# Patient Record
Sex: Female | Born: 1960 | Race: Black or African American | Hispanic: No | Marital: Single | Smoking: Former smoker
Health system: Southern US, Community
[De-identification: ages and names within clinical notes are randomized; demographics above are authoritative.]

## PROBLEM LIST (undated history)

## (undated) DIAGNOSIS — J189 Pneumonia, unspecified organism: Secondary | ICD-10-CM

## (undated) DIAGNOSIS — R51 Headache: Secondary | ICD-10-CM

## (undated) DIAGNOSIS — I2510 Atherosclerotic heart disease of native coronary artery without angina pectoris: Secondary | ICD-10-CM

## (undated) DIAGNOSIS — E079 Disorder of thyroid, unspecified: Secondary | ICD-10-CM

## (undated) DIAGNOSIS — E785 Hyperlipidemia, unspecified: Secondary | ICD-10-CM

## (undated) DIAGNOSIS — Z950 Presence of cardiac pacemaker: Secondary | ICD-10-CM

## (undated) DIAGNOSIS — E039 Hypothyroidism, unspecified: Secondary | ICD-10-CM

## (undated) DIAGNOSIS — N189 Chronic kidney disease, unspecified: Secondary | ICD-10-CM

## (undated) DIAGNOSIS — R569 Unspecified convulsions: Secondary | ICD-10-CM

## (undated) DIAGNOSIS — M1990 Unspecified osteoarthritis, unspecified site: Secondary | ICD-10-CM

## (undated) DIAGNOSIS — I10 Essential (primary) hypertension: Secondary | ICD-10-CM

## (undated) DIAGNOSIS — Z9289 Personal history of other medical treatment: Secondary | ICD-10-CM

## (undated) DIAGNOSIS — D649 Anemia, unspecified: Secondary | ICD-10-CM

## (undated) HISTORY — DX: Disorder of thyroid, unspecified: E07.9

## (undated) HISTORY — PX: DILATION AND CURETTAGE OF UTERUS: SHX78

## (undated) HISTORY — PX: COLONOSCOPY: SHX174

## (undated) HISTORY — DX: Essential (primary) hypertension: I10

## (undated) HISTORY — PX: ENDOMETRIAL BIOPSY: SHX622

## (undated) HISTORY — DX: Hyperlipidemia, unspecified: E78.5

## (undated) HISTORY — DX: Atherosclerotic heart disease of native coronary artery without angina pectoris: I25.10

## (undated) HISTORY — DX: Chronic kidney disease, unspecified: N18.9

## (undated) HISTORY — PX: CORONARY ARTERY BYPASS GRAFT: SHX141

## (undated) HISTORY — PX: RENAL BIOPSY: SHX156

## (undated) HISTORY — PX: PACEMAKER INSERTION: SHX728

## (undated) HISTORY — DX: Anemia, unspecified: D64.9

---

## 2003-06-03 ENCOUNTER — Inpatient Hospital Stay (HOSPITAL_COMMUNITY): Admission: EM | Admit: 2003-06-03 | Discharge: 2003-06-05 | Payer: Self-pay | Admitting: Emergency Medicine

## 2005-02-07 ENCOUNTER — Inpatient Hospital Stay (HOSPITAL_COMMUNITY): Admission: RE | Admit: 2005-02-07 | Discharge: 2005-02-20 | Payer: Self-pay | Admitting: Cardiology

## 2005-02-07 ENCOUNTER — Ambulatory Visit: Payer: Self-pay | Admitting: Dentistry

## 2005-03-08 ENCOUNTER — Inpatient Hospital Stay (HOSPITAL_COMMUNITY): Admission: AD | Admit: 2005-03-08 | Discharge: 2005-03-13 | Payer: Self-pay | Admitting: Cardiothoracic Surgery

## 2005-03-22 ENCOUNTER — Encounter: Admission: RE | Admit: 2005-03-22 | Discharge: 2005-03-22 | Payer: Self-pay | Admitting: Cardiothoracic Surgery

## 2007-11-24 IMAGING — DX DG ORTHOPANTOGRAM /PANORAMIC
1 series · 1 of 1 positions shown · non-contrast
Comparison: none

CLINICAL DATA: Pre-open heart surgery work-up.  Chipped teeth.
PANOREX - 1 VIEW:
There are multiple missing teeth.  There is root for a right maxillary molar.  The crown is not present.  There are multiple (4-5) roots for left maxillary teeth with the crowns missing.  Central maxillary teeth are missing.  Mandibular central incisors are intact.  There is very poor dentition with prominent periapical lucencies about multiple right mandibular tooth roots.  The right mandibular first and second molars appear to be present with prominent periapical lucencies involving the first molar.  There appears to be a piece of a tooth projecting just lateral to the right mandibular second molar.  The crowns of the right and left mandibular first molars appear destroyed.

[view not recorded]
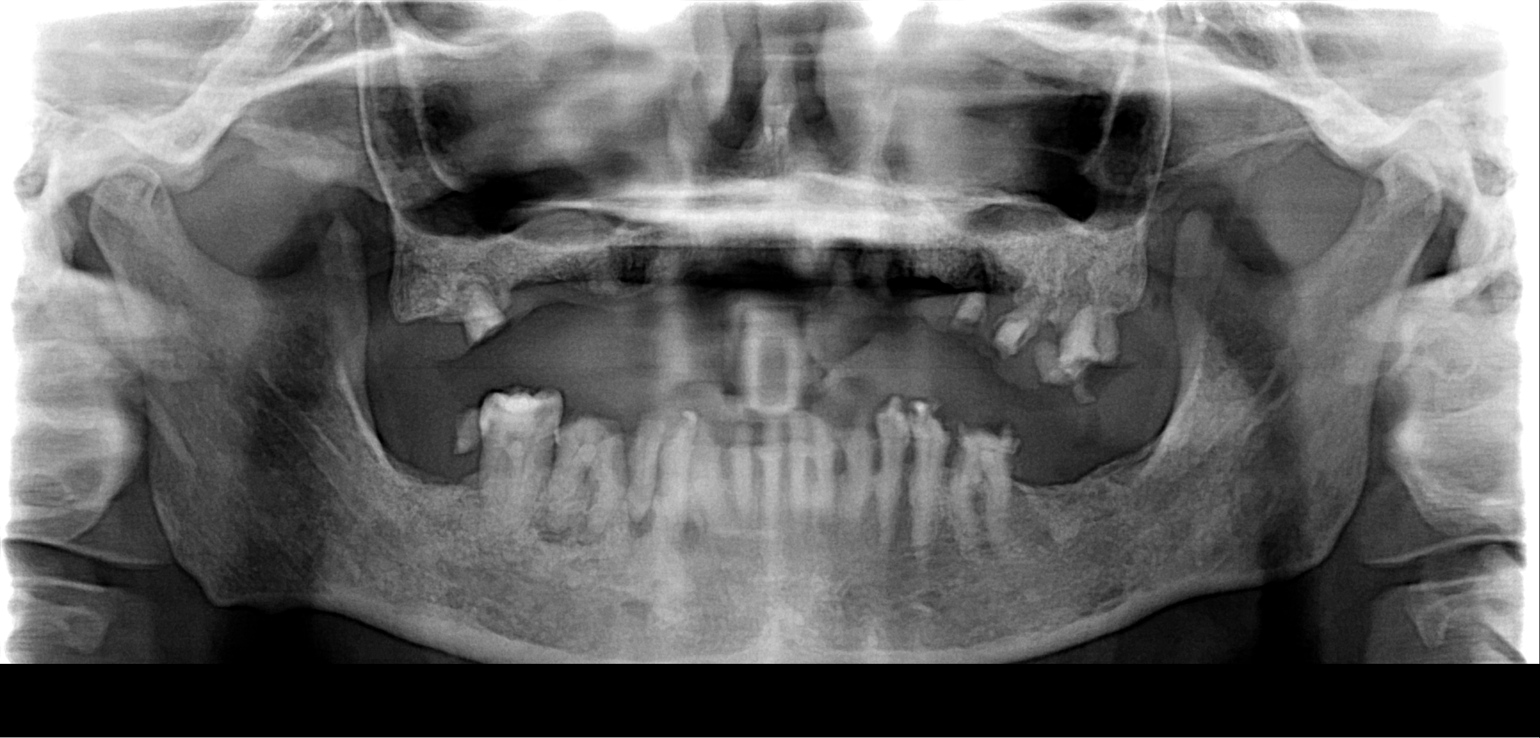

[1 of 1 positions shown; findings below may reference images not displayed]

IMPRESSION: Findings of very poor dental hygiene.  See comments above.

## 2007-11-27 IMAGING — CR DG CHEST 1V PORT
1 series · 1 of 1 positions shown · non-contrast
Comparison: Preoperative exam 02/08/05.

CLINICAL DATA: Chest pain.  Status post CABG.  
PORTABLE CHEST - 1 VIEW, 02/11/05, 2556 HOURS:

[view not recorded]
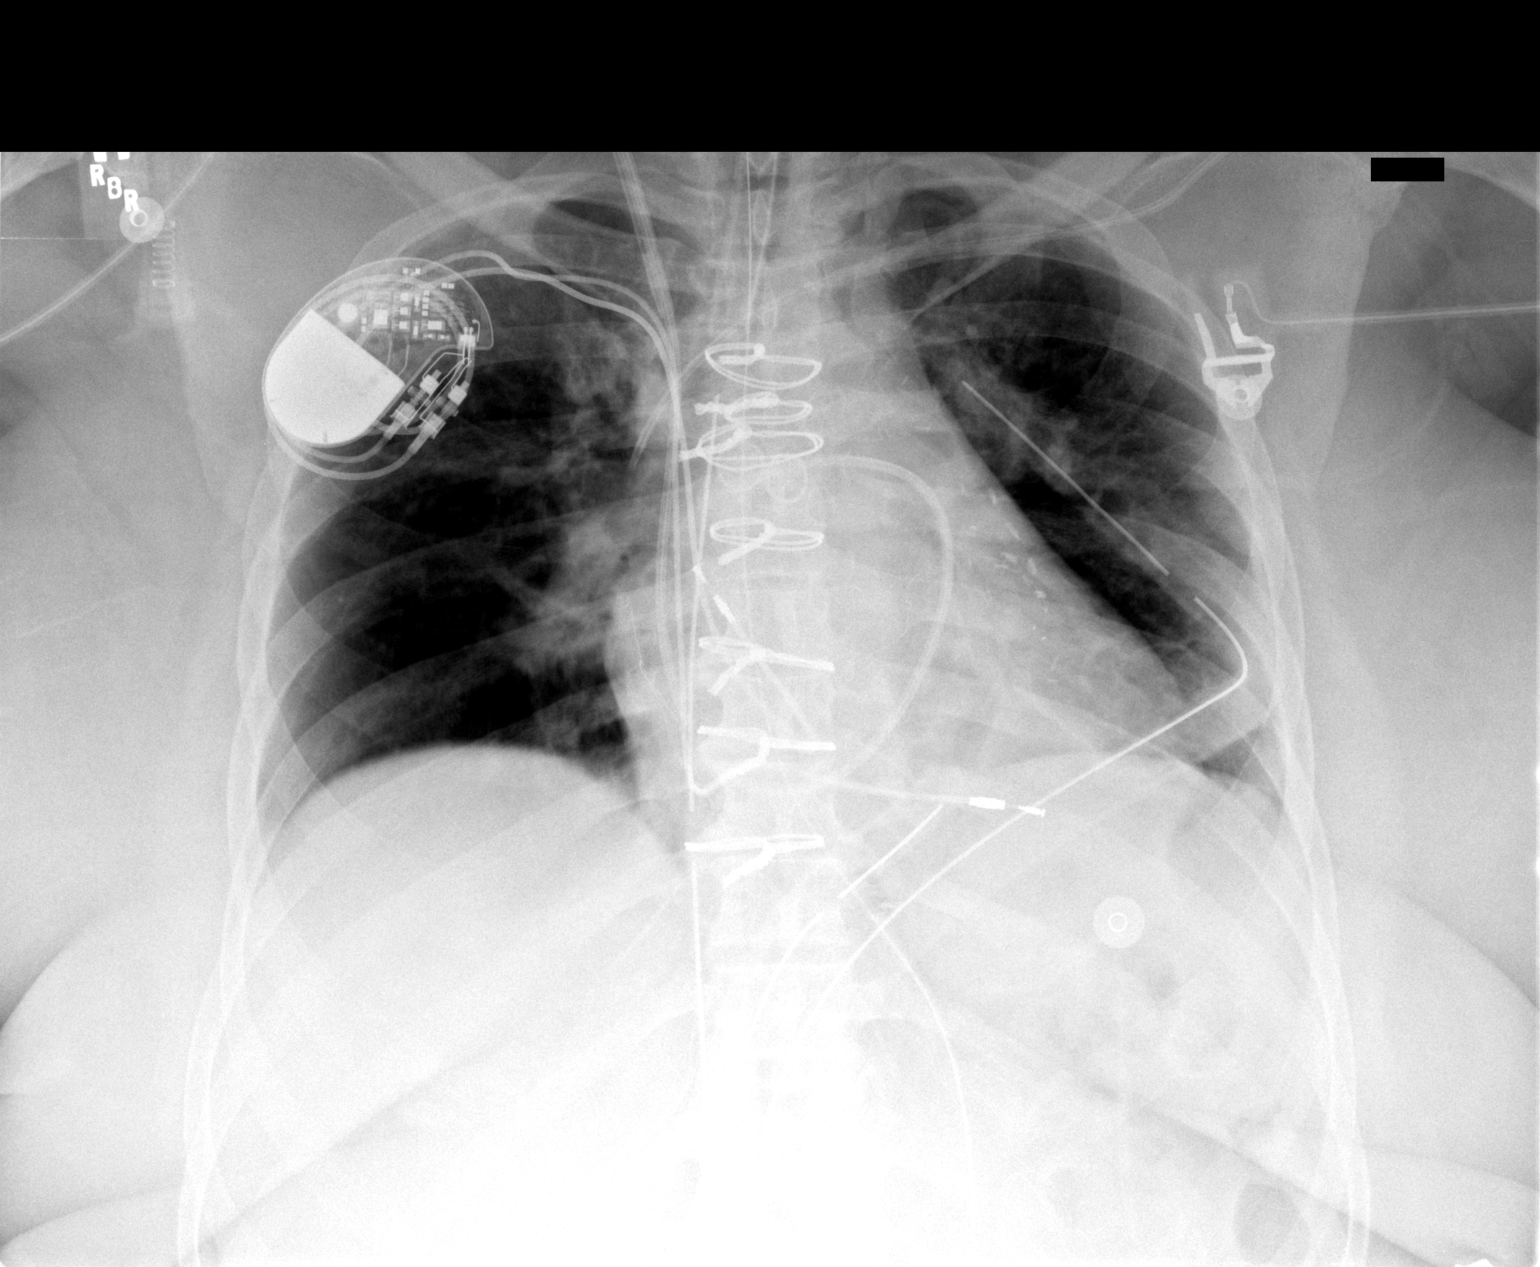

[1 of 1 positions shown; findings below may reference images not displayed]

FINDINGS: Sternal wire sutures and mediastinal clips, mediastinal and left pleural chest tubes.  Left subclavian central venous catheter tip is in the superior vena cava.  Satisfactory endotracheal tube and nasogastric tube positions.  Pre-existing pacer battery pack with right atrial and right ventricular pacer leads.  Minimal atelectasis at the left base.  No pneumothorax or vascular congestion.
IMPRESSION: Satisfactory post CABG chest x-ray.

## 2013-05-13 ENCOUNTER — Other Ambulatory Visit: Payer: Self-pay | Admitting: *Deleted

## 2013-05-13 DIAGNOSIS — N186 End stage renal disease: Secondary | ICD-10-CM

## 2013-05-13 DIAGNOSIS — Z01810 Encounter for preprocedural cardiovascular examination: Secondary | ICD-10-CM

## 2013-05-21 ENCOUNTER — Encounter: Payer: Self-pay | Admitting: Vascular Surgery

## 2013-05-27 ENCOUNTER — Encounter: Payer: Self-pay | Admitting: Vascular Surgery

## 2013-05-28 ENCOUNTER — Ambulatory Visit (HOSPITAL_COMMUNITY)
Admission: RE | Admit: 2013-05-28 | Discharge: 2013-05-28 | Disposition: A | Discharge status: Home / Self Care | Payer: No Typology Code available for payment source | Source: Ambulatory Visit | Attending: Vascular Surgery | Admitting: Vascular Surgery

## 2013-05-28 ENCOUNTER — Ambulatory Visit (INDEPENDENT_AMBULATORY_CARE_PROVIDER_SITE_OTHER): Payer: PRIVATE HEALTH INSURANCE | Admitting: Vascular Surgery

## 2013-05-28 ENCOUNTER — Other Ambulatory Visit: Payer: No Typology Code available for payment source

## 2013-05-28 ENCOUNTER — Ambulatory Visit (INDEPENDENT_AMBULATORY_CARE_PROVIDER_SITE_OTHER)
Admission: RE | Admit: 2013-05-28 | Discharge: 2013-05-28 | Disposition: A | Discharge status: Home / Self Care | Payer: No Typology Code available for payment source | Source: Ambulatory Visit | Attending: Vascular Surgery | Admitting: Vascular Surgery

## 2013-05-28 ENCOUNTER — Encounter: Payer: Self-pay | Admitting: Vascular Surgery

## 2013-05-28 VITALS — BP 169/88 | HR 70 | Ht 65.0 in | Wt 215.0 lb

## 2013-05-28 DIAGNOSIS — N186 End stage renal disease: Secondary | ICD-10-CM

## 2013-05-28 DIAGNOSIS — Z01810 Encounter for preprocedural cardiovascular examination: Secondary | ICD-10-CM

## 2013-05-28 NOTE — Progress Notes (Signed)
  Referred by:  Belayenh S Befekadu, MD [address] [place], [place] [address]  Reason for referral: New access  History of Present Illness  Amy Mathews is a 52 y.o. (11/16/1960) female who presents for evaluation for permanent access.  The patient is left hand dominant but uses her right arm for most tasks.  The patient has not had previous access procedures.  Previous central venous cannulation procedures include: RIJ TDC and prior Left temp. Dialysis catheter.  The patient has never had a PPM placed.   Past Medical History  Diagnosis Date  . Anemia   . Chronic kidney disease   . CAD (coronary artery disease)   . Thyroid disease     hypothyroidism  . Hyperlipidemia   . Hypertension     Past Surgical History  Procedure Laterality Date  . Coronary artery bypass graft    . Pacemaker insertion    . Endometrial biopsy    . Renal biopsy      History   Social History  . Marital Status: Single    Spouse Name: N/A    Number of Children: N/A  . Years of Education: N/A   Occupational History  . Not on file.   Social History Main Topics  . Smoking status: Former Smoker    Types: Cigarettes    Quit date: 03/05/2003  . Smokeless tobacco: Never Used  . Alcohol Use: No  . Drug Use: No  . Sexual Activity: Not on file   Other Topics Concern  . Not on file   Social History Narrative  . No narrative on file    Family history: pt does not know her parent's medical history  Current Outpatient Prescriptions on File Prior to Visit  Medication Sig Dispense Refill  . allopurinol (ZYLOPRIM) 100 MG tablet Take 100 mg by mouth daily. Take two tablets daily      . aspirin 81 MG tablet Take 81 mg by mouth daily.      . calcium acetate (PHOSLO) 667 MG capsule Take 667 mg by mouth 3 (three) times daily with meals.      . furosemide (LASIX) 40 MG tablet Take 40 mg by mouth 2 (two) times daily.      . hydrALAZINE (APRESOLINE) 25 MG tablet Take 25 mg by mouth 2  (two) times daily.      . labetalol (NORMODYNE) 300 MG tablet Take 300 mg by mouth 2 (two) times daily.      . levothyroxine (SYNTHROID, LEVOTHROID) 125 MCG tablet Take 125 mcg by mouth daily before breakfast.      . omeprazole (PRILOSEC) 40 MG capsule Take 40 mg by mouth daily.      . simvastatin (ZOCOR) 20 MG tablet Take 20 mg by mouth daily.      . ferrous sulfate 324 (65 FE) MG TBEC Take 324 mg by mouth 2 (two) times daily.       No current facility-administered medications on file prior to visit.    Allergies  Allergen Reactions  . Penicillins     REVIEW OF SYSTEMS:  (Positives checked otherwise negative)  CARDIOVASCULAR:  [] chest pain, [] chest pressure, [] palpitations, [] shortness of breath when laying flat, [] shortness of breath with exertion,  [] pain in feet when walking, [] pain in feet when laying flat, [] history of blood clot in veins (DVT), [] history of phlebitis, [] swelling in legs, [] varicose veins  PULMONARY:  [] productive cough, [] asthma, []   wheezing  NEUROLOGIC:  []  weakness in arms or legs, []  numbness in arms or legs, []  difficulty speaking or slurred speech, []  temporary loss of vision in one eye, [x]  dizziness  HEMATOLOGIC:  []  bleeding problems, []  problems with blood clotting too easily  MUSCULOSKEL:  []  joint pain, []  joint swelling  GASTROINTEST:  []  vomiting blood, []  blood in stool     GENITOURINARY:  []  burning with urination, []  blood in urine  PSYCHIATRIC:  []  history of major depression  INTEGUMENTARY:  []  rashes, []  ulcers  CONSTITUTIONAL:  []  fever, []  chills  Physical Examination  Filed Vitals:   05/28/13 1423  BP: 169/88  Pulse: 70  Height: 5\' 5"  (1.651 m)  Weight: 215 lb (97.523 kg)  SpO2: 99%   Body mass index is 35.78 kg/(m^2).  General: A&O x 3, WD, mildly obese  Head: [place]/AT  Ear/Nose/Throat: Hearing grossly intact, nares w/o erythema or drainage, oropharynx w/o Erythema/Exudate, Mallampati score: 3  Eyes:  PERRLA, EOMI  Neck: Supple, no nuchal rigidity, no palpable LAD, bilateral neck incision c/w prior lines  Pulmonary: Sym exp, good air movt, CTAB, no rales, rhonchi, & wheezing  Cardiac: RRR, Nl S1, S2, no Murmurs, rubs or gallops  Vascular: Vessel Right Left  Radial Palpable Palpable  Ulnar Faintly Palpable Faintly Palpable  Brachial Palpable Palpable  Carotid Palpable, without bruit Palpable, without bruit  Aorta Not palpable N/A  Femoral Palpable Palpable  Popliteal Not palpable Not palpable  PT Palpable Palpable  DP Palpable Palpable   Gastrointestinal: soft, NTND, -G/R, - HSM, - masses, - CVAT B  Musculoskeletal: M/S 5/5 throughout , Extremities without ischemic changes   Neurologic: CN 2-12 intact , Pain and light touch intact in extremities , Motor exam as listed above  Psychiatric: Judgment intact, Mood & affect appropriate for pt's clinical situation  Dermatologic: See M/S exam for extremity exam, no rashes otherwise noted  Lymph : No Cervical, Axillary, or Inguinal lymphadenopathy   Non-Invasive Vascular Imaging  Vein Mapping  (Date: 05/28/2013):   R arm: acceptable vein conduits include none  L arm: acceptable vein conduits include none  BUE arterial doppler (05/28/2013)  Triphasic flow in all arteries  Only brachial arteries larger enough for use for access purposes  Outside Studies/Documentation 5 pages of outside documents were reviewed including: outpatient nephrology chart and outpatient hospital chart.  Medical Decision Making  Amy Mathews is a 53 y.o. female who presents with ESRD requiring hemodialysis.   Based on vein mapping and examination, this patient's permanent access options include: likely BUA AVG.    I would recommend: B arm and central venogram to better determine this patient's access options given the multiple scars in the neck/chest c/w central access.  The patient has agreed to proceed with the above procedure which  will be scheduled 30 Mar 15.  Amy SakeBrian Arnika Larzelere, MD [facility] Office: [phone] Pager: [phone]  05/28/2013, 3:27 PM

## 2013-05-31 ENCOUNTER — Encounter (HOSPITAL_COMMUNITY)
Admission: RE | Disposition: A | Discharge status: Home / Self Care | Payer: Self-pay | Source: Ambulatory Visit | Attending: Vascular Surgery

## 2013-05-31 ENCOUNTER — Ambulatory Visit (HOSPITAL_COMMUNITY)
Admission: RE | Admit: 2013-05-31 | Discharge: 2013-05-31 | Disposition: A | Discharge status: Home / Self Care | Payer: PRIVATE HEALTH INSURANCE | Source: Ambulatory Visit | Attending: Vascular Surgery | Admitting: Vascular Surgery

## 2013-05-31 DIAGNOSIS — E039 Hypothyroidism, unspecified: Secondary | ICD-10-CM | POA: Insufficient documentation

## 2013-05-31 DIAGNOSIS — Z951 Presence of aortocoronary bypass graft: Secondary | ICD-10-CM | POA: Insufficient documentation

## 2013-05-31 DIAGNOSIS — Z7982 Long term (current) use of aspirin: Secondary | ICD-10-CM | POA: Insufficient documentation

## 2013-05-31 DIAGNOSIS — E785 Hyperlipidemia, unspecified: Secondary | ICD-10-CM | POA: Insufficient documentation

## 2013-05-31 DIAGNOSIS — I2510 Atherosclerotic heart disease of native coronary artery without angina pectoris: Secondary | ICD-10-CM | POA: Insufficient documentation

## 2013-05-31 DIAGNOSIS — N186 End stage renal disease: Secondary | ICD-10-CM

## 2013-05-31 DIAGNOSIS — I120 Hypertensive chronic kidney disease with stage 5 chronic kidney disease or end stage renal disease: Secondary | ICD-10-CM | POA: Insufficient documentation

## 2013-05-31 DIAGNOSIS — Z87891 Personal history of nicotine dependence: Secondary | ICD-10-CM | POA: Insufficient documentation

## 2013-05-31 DIAGNOSIS — Z950 Presence of cardiac pacemaker: Secondary | ICD-10-CM | POA: Insufficient documentation

## 2013-05-31 DIAGNOSIS — D649 Anemia, unspecified: Secondary | ICD-10-CM | POA: Insufficient documentation

## 2013-05-31 DIAGNOSIS — Z992 Dependence on renal dialysis: Secondary | ICD-10-CM | POA: Insufficient documentation

## 2013-05-31 HISTORY — PX: VENOGRAM: SHX5497

## 2013-05-31 LAB — POCT I-STAT, CHEM 8
BUN: 29 mg/dL — AB (ref 6–23)
CALCIUM ION: 1.14 mmol/L (ref 1.12–1.23)
Chloride: 102 mEq/L (ref 96–112)
Creatinine, Ser: 6.4 mg/dL — ABNORMAL HIGH (ref 0.50–1.10)
Glucose, Bld: 87 mg/dL (ref 70–99)
HEMATOCRIT: 28 % — AB (ref 36.0–46.0)
Hemoglobin: 9.5 g/dL — ABNORMAL LOW (ref 12.0–15.0)
Potassium: 4.3 mEq/L (ref 3.7–5.3)
Sodium: 139 mEq/L (ref 137–147)
TCO2: 24 mmol/L (ref 0–100)

## 2013-05-31 SURGERY — VENOGRAM
Anesthesia: LOCAL

## 2013-05-31 MED ORDER — SODIUM CHLORIDE 0.9 % IJ SOLN: 3.0000 mL | INTRAMUSCULAR | Status: DC | PRN

## 2013-05-31 NOTE — Op Note (Signed)
   PATIENT: Ennis FortsMonica J Sliter   MRN: [id number] DOB: 1961-01-29    DATE OF PROCEDURE: 05/31/2013  INDICATIONS: Ennis FortsMonica J Saha is a 53 y.o. female who presented for evaluation for new hemodialysis access. She was set up for a venogram prior to scheduling her surgery by Dr. Imogene Burnhen.  PROCEDURE: bilateral upper extremity venogram  SURGEON: Di Kindlehristopher S. Edilia Boickson, MD, FACS  ANESTHESIA: none   EBL: none  TECHNIQUE: Patient was brought to the peripheral vascular lab after a peripheral IV was started on the dorsum of both hands. Initially study was obtained on the right side. Contrast was injected through the IV and the veins visualized from the arm to the chest. Next contrast was injected through the IV and the left hand and the veins were visualized from the hand to the central veins.  FINDINGS:  1. On the right side, the brachial vein is patent as is the axillary and subclavian veins. The cephalic vein is small but patent. The patient has a pacemaker on the right and also a right IJ tunneled dialysis catheter. There is poor visualization of the right brachiocephalic vein and superior vena cava. 2. On the left side, the brachial vein is patent as is the cephalic vein although the cephalic vein is small. The axillary subclavian and left brachiocephalic veins are patent. There is poor visualization of the superior vena cava.  Waverly Ferrarihristopher Jere Bostrom, MD, FACS [facility]  DATE OF DICTATION:   05/31/2013

## 2013-05-31 NOTE — Discharge Instructions (Signed)
Venogram, Care After ° °Refer to this sheet in the next few weeks. These instructions provide you with information on caring for yourself after your procedure. Your health care provider may also give you more specific instructions. Your treatment has been planned according to current medical practices, but problems sometimes occur. Call your health care provider if you have any problems or questions after your procedure. °WHAT TO EXPECT AFTER THE PROCEDURE °After your procedure, it is typical to have the following sensations: °· Mild discomfort at the catheter insertion site. °HOME CARE INSTRUCTIONS  °· Take all medicines exactly as directed. °· Follow any prescribed diet. °· Follow instructions regarding both rest and physical activity. °· Drink more fluids for the first several days after the procedure, in order to help flush dye from your kidneys. °SEEK MEDICAL CARE IF: °· You develop a rash. °SEEK IMMEDIATE MEDICAL CARE IF °· You have fever not controlled by medicine. °· There is pain, drainage, bleeding, redness, swelling, warmth or a red streak at the site of the IV tube. °· The extremity where your IV tube was placed becomes discolored, numb, or cool. °· You have difficulty breathing or shortness of breath. °· You develop chest pain. °· You have excessive dizziness or fainting. °Document Released: 12/09/2012 Document Reviewed: 10/26/2012 °[employer]® Patient Information ©2014 [employer]. ° °

## 2013-05-31 NOTE — H&P (View-Only) (Signed)
Referred by:  Jamse MeadBelayenh S Befekadu, MD [phone] [place], [place] [id number]  Reason for referral: New access  History of Present Illness  Amy Mathews is a 53 y.o. (Nov 06, 1960) female who presents for evaluation for permanent access.  The patient is left hand dominant but uses her right arm for most tasks.  The patient has not had previous access procedures.  Previous central venous cannulation procedures include: RIJ TDC and prior Left temp. Dialysis catheter.  The patient has never had a PPM placed.   Past Medical History  Diagnosis Date  . Anemia   . Chronic kidney disease   . CAD (coronary artery disease)   . Thyroid disease     hypothyroidism  . Hyperlipidemia   . Hypertension     Past Surgical History  Procedure Laterality Date  . Coronary artery bypass graft    . Pacemaker insertion    . Endometrial biopsy    . Renal biopsy      History   Social History  . Marital Status: Single    Spouse Name: N/A    Number of Children: N/A  . Years of Education: N/A   Occupational History  . Not on file.   Social History Main Topics  . Smoking status: Former Smoker    Types: Cigarettes    Quit date: 03/05/2003  . Smokeless tobacco: Never Used  . Alcohol Use: No  . Drug Use: No  . Sexual Activity: Not on file   Other Topics Concern  . Not on file   Social History Narrative  . No narrative on file    Family history: pt does not know her parent's medical history  Current Outpatient Prescriptions on File Prior to Visit  Medication Sig Dispense Refill  . allopurinol (ZYLOPRIM) 100 MG tablet Take 100 mg by mouth daily. Take two tablets daily      . aspirin 81 MG tablet Take 81 mg by mouth daily.      . calcium acetate (PHOSLO) 667 MG capsule Take 667 mg by mouth 3 (three) times daily with meals.      . furosemide (LASIX) 40 MG tablet Take 40 mg by mouth 2 (two) times daily.      . hydrALAZINE (APRESOLINE) 25 MG tablet Take 25 mg by mouth 2  (two) times daily.      Marland Kitchen. labetalol (NORMODYNE) 300 MG tablet Take 300 mg by mouth 2 (two) times daily.      Marland Kitchen. levothyroxine (SYNTHROID, LEVOTHROID) 125 MCG tablet Take 125 mcg by mouth daily before breakfast.      . omeprazole (PRILOSEC) 40 MG capsule Take 40 mg by mouth daily.      . simvastatin (ZOCOR) 20 MG tablet Take 20 mg by mouth daily.      . ferrous sulfate 324 (65 FE) MG TBEC Take 324 mg by mouth 2 (two) times daily.       No current facility-administered medications on file prior to visit.    Allergies  Allergen Reactions  . Penicillins     REVIEW OF SYSTEMS:  (Positives checked otherwise negative)  CARDIOVASCULAR:  []  chest pain, []  chest pressure, []  palpitations, []  shortness of breath when laying flat, []  shortness of breath with exertion,  []  pain in feet when walking, []  pain in feet when laying flat, []  history of blood clot in veins (DVT), []  history of phlebitis, []  swelling in legs, []  varicose veins  PULMONARY:  []  productive cough, []  asthma, []   wheezing  NEUROLOGIC:  []  weakness in arms or legs, []  numbness in arms or legs, []  difficulty speaking or slurred speech, []  temporary loss of vision in one eye, [x]  dizziness  HEMATOLOGIC:  []  bleeding problems, []  problems with blood clotting too easily  MUSCULOSKEL:  []  joint pain, []  joint swelling  GASTROINTEST:  []  vomiting blood, []  blood in stool     GENITOURINARY:  []  burning with urination, []  blood in urine  PSYCHIATRIC:  []  history of major depression  INTEGUMENTARY:  []  rashes, []  ulcers  CONSTITUTIONAL:  []  fever, []  chills  Physical Examination  Filed Vitals:   05/28/13 1423  BP: 169/88  Pulse: 70  Height: 5\' 5"  (1.651 m)  Weight: 215 lb (97.523 kg)  SpO2: 99%   Body mass index is 35.78 kg/(m^2).  General: A&O x 3, WD, mildly obese  Head: [place]/AT  Ear/Nose/Throat: Hearing grossly intact, nares w/o erythema or drainage, oropharynx w/o Erythema/Exudate, Mallampati score: 3  Eyes:  PERRLA, EOMI  Neck: Supple, no nuchal rigidity, no palpable LAD, bilateral neck incision c/w prior lines  Pulmonary: Sym exp, good air movt, CTAB, no rales, rhonchi, & wheezing  Cardiac: RRR, Nl S1, S2, no Murmurs, rubs or gallops  Vascular: Vessel Right Left  Radial Palpable Palpable  Ulnar Faintly Palpable Faintly Palpable  Brachial Palpable Palpable  Carotid Palpable, without bruit Palpable, without bruit  Aorta Not palpable N/A  Femoral Palpable Palpable  Popliteal Not palpable Not palpable  PT Palpable Palpable  DP Palpable Palpable   Gastrointestinal: soft, NTND, -G/R, - HSM, - masses, - CVAT B  Musculoskeletal: M/S 5/5 throughout , Extremities without ischemic changes   Neurologic: CN 2-12 intact , Pain and light touch intact in extremities , Motor exam as listed above  Psychiatric: Judgment intact, Mood & affect appropriate for pt's clinical situation  Dermatologic: See M/S exam for extremity exam, no rashes otherwise noted  Lymph : No Cervical, Axillary, or Inguinal lymphadenopathy   Non-Invasive Vascular Imaging  Vein Mapping  (Date: 05/28/2013):   R arm: acceptable vein conduits include none  L arm: acceptable vein conduits include none  BUE arterial doppler (05/28/2013)  Triphasic flow in all arteries  Only brachial arteries larger enough for use for access purposes  Outside Studies/Documentation 5 pages of outside documents were reviewed including: outpatient nephrology chart and outpatient hospital chart.  Medical Decision Making  Amy Mathews is a 53 y.o. female who presents with ESRD requiring hemodialysis.   Based on vein mapping and examination, this patient's permanent access options include: likely BUA AVG.    I would recommend: B arm and central venogram to better determine this patient's access options given the multiple scars in the neck/chest c/w central access.  The patient has agreed to proceed with the above procedure which  will be scheduled 30 Mar 15.  Leonides SakeBrian Vanissa Strength, MD [facility] Office: [phone] Pager: [phone]  05/28/2013, 3:27 PM

## 2013-05-31 NOTE — Interval H&P Note (Signed)
History and Physical Interval Note:  05/31/2013 8:40 AM  Amy FortsMonica J Scali  has presented today for surgery, with the diagnosis of End stage Renal  The various methods of treatment have been discussed with the patient and family. After consideration of risks, benefits and other options for treatment, the patient has consented to  Procedure(s): VENOGRAM (N/A) as a surgical intervention .  The patient's history has been reviewed, patient examined, no change in status, stable for surgery.  I have reviewed the patient's chart and labs.  Questions were answered to the patient's satisfaction.     DICKSON,CHRISTOPHER S

## 2013-06-02 ENCOUNTER — Other Ambulatory Visit: Payer: Self-pay

## 2013-06-14 ENCOUNTER — Encounter (HOSPITAL_COMMUNITY): Payer: Self-pay | Admitting: *Deleted

## 2013-06-14 NOTE — Progress Notes (Signed)
According to pt, she made Dr. Nicky Pughhen's nurse aware that she can't take Labetalol (NORMODYNE) 300 MG tablet on an empty stomach; she experienced N/V during her recent procedure. Pt stated that nurse instructed her to take the other BP medication Hydralazine. Pt unable to remember cardiologist name and number that monitored her pacemaker; pt stated that she has not been seen by a cardiologist nor has she had her pacemaker checked since 2007-08. Pt denies SOB and chest pain.

## 2013-06-15 MED ORDER — CHLORHEXIDINE GLUCONATE CLOTH 2 % EX PADS: 6.0000 | MEDICATED_PAD | Freq: Once | CUTANEOUS | Status: DC

## 2013-06-15 MED ORDER — CIPROFLOXACIN IN D5W 400 MG/200ML IV SOLN
400.0000 mg | INTRAVENOUS | Status: AC
  Administered 2013-06-16: 400 mg via INTRAVENOUS
  Filled 2013-06-15: qty 200

## 2013-06-15 MED ORDER — SODIUM CHLORIDE 0.9 % IV SOLN
INTRAVENOUS | Status: DC
  Administered 2013-06-16: 09:00:00 via INTRAVENOUS

## 2013-06-16 ENCOUNTER — Encounter (HOSPITAL_COMMUNITY): Payer: Self-pay | Admitting: *Deleted

## 2013-06-16 ENCOUNTER — Ambulatory Visit (HOSPITAL_COMMUNITY): Payer: No Typology Code available for payment source

## 2013-06-16 ENCOUNTER — Encounter (HOSPITAL_COMMUNITY)
Admission: RE | Disposition: A | Discharge status: Home / Self Care | Payer: Self-pay | Source: Ambulatory Visit | Attending: Vascular Surgery

## 2013-06-16 ENCOUNTER — Ambulatory Visit (HOSPITAL_COMMUNITY): Payer: No Typology Code available for payment source | Admitting: Anesthesiology

## 2013-06-16 ENCOUNTER — Ambulatory Visit (HOSPITAL_COMMUNITY)
Admission: RE | Admit: 2013-06-16 | Discharge: 2013-06-16 | Disposition: A | Discharge status: Home / Self Care | Payer: No Typology Code available for payment source | Source: Ambulatory Visit | Attending: Vascular Surgery | Admitting: Vascular Surgery

## 2013-06-16 ENCOUNTER — Encounter (HOSPITAL_COMMUNITY): Payer: PRIVATE HEALTH INSURANCE | Admitting: Anesthesiology

## 2013-06-16 DIAGNOSIS — I120 Hypertensive chronic kidney disease with stage 5 chronic kidney disease or end stage renal disease: Secondary | ICD-10-CM | POA: Insufficient documentation

## 2013-06-16 DIAGNOSIS — N184 Chronic kidney disease, stage 4 (severe): Secondary | ICD-10-CM

## 2013-06-16 DIAGNOSIS — N186 End stage renal disease: Secondary | ICD-10-CM | POA: Insufficient documentation

## 2013-06-16 DIAGNOSIS — Z950 Presence of cardiac pacemaker: Secondary | ICD-10-CM | POA: Insufficient documentation

## 2013-06-16 HISTORY — DX: Headache: R51

## 2013-06-16 HISTORY — DX: Unspecified convulsions: R56.9

## 2013-06-16 HISTORY — DX: Personal history of other medical treatment: Z92.89

## 2013-06-16 HISTORY — DX: Hypothyroidism, unspecified: E03.9

## 2013-06-16 HISTORY — DX: Pneumonia, unspecified organism: J18.9

## 2013-06-16 HISTORY — DX: Presence of cardiac pacemaker: Z95.0

## 2013-06-16 HISTORY — PX: BASCILIC VEIN TRANSPOSITION: SHX5742

## 2013-06-16 HISTORY — DX: Unspecified osteoarthritis, unspecified site: M19.90

## 2013-06-16 LAB — POCT I-STAT 4, (NA,K, GLUC, HGB,HCT)
Glucose, Bld: 81 mg/dL (ref 70–99)
HCT: 31 % — ABNORMAL LOW (ref 36.0–46.0)
Hemoglobin: 10.5 g/dL — ABNORMAL LOW (ref 12.0–15.0)
Potassium: 4.7 mEq/L (ref 3.7–5.3)
SODIUM: 140 meq/L (ref 137–147)

## 2013-06-16 SURGERY — TRANSPOSITION, VEIN, BASILIC
Anesthesia: Monitor Anesthesia Care | Site: Arm Upper | Laterality: Left

## 2013-06-16 MED ORDER — PROPOFOL 10 MG/ML IV BOLUS
INTRAVENOUS | Status: AC
  Filled 2013-06-16: qty 20

## 2013-06-16 MED ORDER — PROPOFOL INFUSION 10 MG/ML OPTIME
INTRAVENOUS | Status: DC | PRN
  Administered 2013-06-16: 30 ug/kg/min via INTRAVENOUS

## 2013-06-16 MED ORDER — BUPIVACAINE HCL (PF) 0.5 % IJ SOLN
INTRAMUSCULAR | Status: DC | PRN
Start: 2013-06-16 — End: 2013-06-16
  Administered 2013-06-16: 3.5 mL

## 2013-06-16 MED ORDER — ONDANSETRON HCL 4 MG/2ML IJ SOLN
INTRAMUSCULAR | Status: DC | PRN
  Administered 2013-06-16: 4 mg via INTRAVENOUS

## 2013-06-16 MED ORDER — LIDOCAINE HCL (PF) 1 % IJ SOLN
INTRAMUSCULAR | Status: AC
  Filled 2013-06-16: qty 30

## 2013-06-16 MED ORDER — OXYCODONE HCL 5 MG PO TABS: 5.0000 mg | ORAL_TABLET | Freq: Four times a day (QID) | ORAL | Status: DC | PRN

## 2013-06-16 MED ORDER — ONDANSETRON HCL 4 MG/2ML IJ SOLN
INTRAMUSCULAR | Status: AC
  Filled 2013-06-16: qty 2

## 2013-06-16 MED ORDER — THROMBIN 20000 UNITS EX SOLR
CUTANEOUS | Status: AC
  Filled 2013-06-16: qty 20000

## 2013-06-16 MED ORDER — OXYCODONE HCL 5 MG PO TABS: 5.0000 mg | ORAL_TABLET | Freq: Once | ORAL | Status: DC | PRN

## 2013-06-16 MED ORDER — FENTANYL CITRATE 0.05 MG/ML IJ SOLN
INTRAMUSCULAR | Status: DC | PRN
  Administered 2013-06-16 (×2): 50 ug via INTRAVENOUS
  Administered 2013-06-16: 25 ug via INTRAVENOUS

## 2013-06-16 MED ORDER — FENTANYL CITRATE 0.05 MG/ML IJ SOLN
INTRAMUSCULAR | Status: AC
  Filled 2013-06-16: qty 5

## 2013-06-16 MED ORDER — OXYCODONE HCL 5 MG/5ML PO SOLN: 5.0000 mg | Freq: Once | ORAL | Status: DC | PRN

## 2013-06-16 MED ORDER — MIDAZOLAM HCL 5 MG/5ML IJ SOLN
INTRAMUSCULAR | Status: DC | PRN
  Administered 2013-06-16: 2 mg via INTRAVENOUS

## 2013-06-16 MED ORDER — HYDROMORPHONE HCL PF 1 MG/ML IJ SOLN: 0.2500 mg | INTRAMUSCULAR | Status: DC | PRN

## 2013-06-16 MED ORDER — MIDAZOLAM HCL 2 MG/2ML IJ SOLN
INTRAMUSCULAR | Status: AC
  Filled 2013-06-16: qty 2

## 2013-06-16 MED ORDER — STERILE WATER FOR INJECTION IJ SOLN
INTRAMUSCULAR | Status: AC
  Filled 2013-06-16: qty 10

## 2013-06-16 MED ORDER — SODIUM CHLORIDE 0.9 % IR SOLN
Status: DC | PRN
  Administered 2013-06-16: 11:00:00

## 2013-06-16 MED ORDER — LABETALOL HCL 5 MG/ML IV SOLN
10.0000 mg | Freq: Once | INTRAVENOUS | Status: AC
  Administered 2013-06-16: 10 mg via INTRAVENOUS
  Filled 2013-06-16: qty 4

## 2013-06-16 MED ORDER — LIDOCAINE HCL (CARDIAC) 20 MG/ML IV SOLN
INTRAVENOUS | Status: DC | PRN
  Administered 2013-06-16: 40 mg via INTRAVENOUS
  Administered 2013-06-16: 20 mg via INTRAVENOUS

## 2013-06-16 MED ORDER — HEPARIN SODIUM (PORCINE) 1000 UNIT/ML IJ SOLN
INTRAMUSCULAR | Status: AC
  Filled 2013-06-16: qty 1

## 2013-06-16 MED ORDER — PROPOFOL 10 MG/ML IV BOLUS
INTRAVENOUS | Status: AC
  Filled 2013-06-16: qty 40

## 2013-06-16 MED ORDER — PROMETHAZINE HCL 25 MG/ML IJ SOLN: 6.2500 mg | INTRAMUSCULAR | Status: DC | PRN

## 2013-06-16 MED ORDER — DEXTROSE 5 % IV SOLN
INTRAVENOUS | Status: DC | PRN
  Administered 2013-06-16: 10:00:00 via INTRAVENOUS

## 2013-06-16 MED ORDER — LIDOCAINE HCL (CARDIAC) 20 MG/ML IV SOLN
INTRAVENOUS | Status: AC
  Filled 2013-06-16: qty 5

## 2013-06-16 MED ORDER — LABETALOL HCL 5 MG/ML IV SOLN
INTRAVENOUS | Status: AC
  Filled 2013-06-16: qty 4

## 2013-06-16 MED ORDER — 0.9 % SODIUM CHLORIDE (POUR BTL) OPTIME
TOPICAL | Status: DC | PRN
  Administered 2013-06-16: 1000 mL

## 2013-06-16 MED ORDER — LIDOCAINE-EPINEPHRINE (PF) 1 %-1:200000 IJ SOLN
INTRAMUSCULAR | Status: DC | PRN
  Administered 2013-06-16: 3.5 mL via INTRADERMAL

## 2013-06-16 MED ORDER — ARTIFICIAL TEARS OP OINT
TOPICAL_OINTMENT | OPHTHALMIC | Status: AC
  Filled 2013-06-16: qty 3.5

## 2013-06-16 MED ORDER — EPHEDRINE SULFATE 50 MG/ML IJ SOLN
INTRAMUSCULAR | Status: AC
  Filled 2013-06-16: qty 1

## 2013-06-16 SURGICAL SUPPLY — 39 items
ADH SKN CLS APL DERMABOND .7 (GAUZE/BANDAGES/DRESSINGS) ×1
CANISTER SUCTION 2500CC (MISCELLANEOUS) ×3 IMPLANT
CLIP TI MEDIUM 24 (CLIP) ×3 IMPLANT
CLIP TI WIDE RED SMALL 24 (CLIP) ×3 IMPLANT
CORDS BIPOLAR (ELECTRODE) IMPLANT
COVER PROBE W GEL 5X96 (DRAPES) ×2 IMPLANT
COVER SURGICAL LIGHT HANDLE (MISCELLANEOUS) ×3 IMPLANT
DECANTER SPIKE VIAL GLASS SM (MISCELLANEOUS) ×1 IMPLANT
DERMABOND ADVANCED (GAUZE/BANDAGES/DRESSINGS) ×2
DERMABOND ADVANCED .7 DNX12 (GAUZE/BANDAGES/DRESSINGS) ×1 IMPLANT
ELECT REM PT RETURN 9FT ADLT (ELECTROSURGICAL) ×3
ELECTRODE REM PT RTRN 9FT ADLT (ELECTROSURGICAL) ×1 IMPLANT
GLOVE BIO SURGEON STRL SZ 6.5 (GLOVE) ×3 IMPLANT
GLOVE BIO SURGEON STRL SZ7 (GLOVE) ×5 IMPLANT
GLOVE BIO SURGEONS STRL SZ 6.5 (GLOVE) ×3
GLOVE BIOGEL PI IND STRL 6.5 (GLOVE) IMPLANT
GLOVE BIOGEL PI IND STRL 7.0 (GLOVE) IMPLANT
GLOVE BIOGEL PI IND STRL 7.5 (GLOVE) ×1 IMPLANT
GLOVE BIOGEL PI INDICATOR 6.5 (GLOVE) ×2
GLOVE BIOGEL PI INDICATOR 7.0 (GLOVE) ×2
GLOVE BIOGEL PI INDICATOR 7.5 (GLOVE) ×2
GOWN STRL REUS W/ TWL LRG LVL3 (GOWN DISPOSABLE) ×3 IMPLANT
GOWN STRL REUS W/TWL LRG LVL3 (GOWN DISPOSABLE) ×9
KIT BASIN OR (CUSTOM PROCEDURE TRAY) ×3 IMPLANT
KIT ROOM TURNOVER OR (KITS) ×3 IMPLANT
NS IRRIG 1000ML POUR BTL (IV SOLUTION) ×3 IMPLANT
PACK CV ACCESS (CUSTOM PROCEDURE TRAY) ×3 IMPLANT
PAD ARMBOARD 7.5X6 YLW CONV (MISCELLANEOUS) ×6 IMPLANT
SPONGE SURGIFOAM ABS GEL 100 (HEMOSTASIS) IMPLANT
SUT MNCRL AB 4-0 PS2 18 (SUTURE) ×3 IMPLANT
SUT PROLENE 6 0 BV (SUTURE) ×3 IMPLANT
SUT PROLENE 7 0 BV 1 (SUTURE) IMPLANT
SUT SILK 2 0 SH (SUTURE) ×3 IMPLANT
SUT VIC AB 3-0 SH 27 (SUTURE) ×3
SUT VIC AB 3-0 SH 27X BRD (SUTURE) ×1 IMPLANT
TOWEL OR 17X24 6PK STRL BLUE (TOWEL DISPOSABLE) ×3 IMPLANT
TOWEL OR 17X26 10 PK STRL BLUE (TOWEL DISPOSABLE) ×3 IMPLANT
UNDERPAD 30X30 INCONTINENT (UNDERPADS AND DIAPERS) ×3 IMPLANT
WATER STERILE IRR 1000ML POUR (IV SOLUTION) ×3 IMPLANT

## 2013-06-16 NOTE — Anesthesia Postprocedure Evaluation (Signed)
Anesthesia Post Note  Patient: Amy Mathews  Procedure(s) Performed: Procedure(s) (LRB): LEFT 1ST STAGE BRACHIAL VEIN TRANSPOSITION  (Left)  Anesthesia type: MAC  Patient location: PACU  Post pain: Pain level controlled  Post assessment: Patient's Cardiovascular Status Stable  Last Vitals:  Filed Vitals:   06/16/13 1200  BP: 133/64  Pulse: 59  Temp:   Resp: 14    Post vital signs: Reviewed and stable  Level of consciousness: sedated  Complications: No apparent anesthesia complications

## 2013-06-16 NOTE — Transfer of Care (Signed)
Immediate Anesthesia Transfer of Care Note  Patient: Amy FortsMonica J Takeda  Procedure(s) Performed: Procedure(s): LEFT 1ST STAGE BRACHIAL VEIN TRANSPOSITION  (Left)  Patient Location: PACU  Anesthesia Type:MAC  Level of Consciousness: awake, alert  and oriented  Airway & Oxygen Therapy: Patient Spontanous Breathing  Post-op Assessment: Report given to PACU RN and Post -op Vital signs reviewed and stable  Post vital signs: Reviewed and stable  Complications: No apparent anesthesia complications

## 2013-06-16 NOTE — Discharge Instructions (Signed)
° ° °  06/16/2013 Amy Mathews [id number] May 21, 1960  Surgeon(s): Fransisco HertzBrian L Chen, MD  Procedure(s): LEFT 1ST STAGE BRACHIAL VEIN TRANSPOSITION   x Do not stick graft for 12 weeks

## 2013-06-16 NOTE — H&P (View-Only) (Signed)
  Referred by:  Belayenh S Befekadu, MD [address] [place], [place] [address]  Reason for referral: New access  History of Present Illness  Amy Mathews is a 52 y.o. (10/17/1960) female who presents for evaluation for permanent access.  The patient is left hand dominant but uses her right arm for most tasks.  The patient has not had previous access procedures.  Previous central venous cannulation procedures include: RIJ TDC and prior Left temp. Dialysis catheter.  The patient has never had a PPM placed.   Past Medical History  Diagnosis Date  . Anemia   . Chronic kidney disease   . CAD (coronary artery disease)   . Thyroid disease     hypothyroidism  . Hyperlipidemia   . Hypertension     Past Surgical History  Procedure Laterality Date  . Coronary artery bypass graft    . Pacemaker insertion    . Endometrial biopsy    . Renal biopsy      History   Social History  . Marital Status: Single    Spouse Name: N/A    Number of Children: N/A  . Years of Education: N/A   Occupational History  . Not on file.   Social History Main Topics  . Smoking status: Former Smoker    Types: Cigarettes    Quit date: 03/05/2003  . Smokeless tobacco: Never Used  . Alcohol Use: No  . Drug Use: No  . Sexual Activity: Not on file   Other Topics Concern  . Not on file   Social History Narrative  . No narrative on file    Family history: pt does not know her parent's medical history  Current Outpatient Prescriptions on File Prior to Visit  Medication Sig Dispense Refill  . allopurinol (ZYLOPRIM) 100 MG tablet Take 100 mg by mouth daily. Take two tablets daily      . aspirin 81 MG tablet Take 81 mg by mouth daily.      . calcium acetate (PHOSLO) 667 MG capsule Take 667 mg by mouth 3 (three) times daily with meals.      . furosemide (LASIX) 40 MG tablet Take 40 mg by mouth 2 (two) times daily.      . hydrALAZINE (APRESOLINE) 25 MG tablet Take 25 mg by mouth 2  (two) times daily.      . labetalol (NORMODYNE) 300 MG tablet Take 300 mg by mouth 2 (two) times daily.      . levothyroxine (SYNTHROID, LEVOTHROID) 125 MCG tablet Take 125 mcg by mouth daily before breakfast.      . omeprazole (PRILOSEC) 40 MG capsule Take 40 mg by mouth daily.      . simvastatin (ZOCOR) 20 MG tablet Take 20 mg by mouth daily.      . ferrous sulfate 324 (65 FE) MG TBEC Take 324 mg by mouth 2 (two) times daily.       No current facility-administered medications on file prior to visit.    Allergies  Allergen Reactions  . Penicillins     REVIEW OF SYSTEMS:  (Positives checked otherwise negative)  CARDIOVASCULAR:  [] chest pain, [] chest pressure, [] palpitations, [] shortness of breath when laying flat, [] shortness of breath with exertion,  [] pain in feet when walking, [] pain in feet when laying flat, [] history of blood clot in veins (DVT), [] history of phlebitis, [] swelling in legs, [] varicose veins  PULMONARY:  [] productive cough, [] asthma, []   wheezing  NEUROLOGIC:  []  weakness in arms or legs, []  numbness in arms or legs, []  difficulty speaking or slurred speech, []  temporary loss of vision in one eye, [x]  dizziness  HEMATOLOGIC:  []  bleeding problems, []  problems with blood clotting too easily  MUSCULOSKEL:  []  joint pain, []  joint swelling  GASTROINTEST:  []  vomiting blood, []  blood in stool     GENITOURINARY:  []  burning with urination, []  blood in urine  PSYCHIATRIC:  []  history of major depression  INTEGUMENTARY:  []  rashes, []  ulcers  CONSTITUTIONAL:  []  fever, []  chills  Physical Examination  Filed Vitals:   05/28/13 1423  BP: 169/88  Pulse: 70  Height: 5\' 5"  (1.651 m)  Weight: 215 lb (97.523 kg)  SpO2: 99%   Body mass index is 35.78 kg/(m^2).  General: A&O x 3, WD, mildly obese  Head: [place]/AT  Ear/Nose/Throat: Hearing grossly intact, nares w/o erythema or drainage, oropharynx w/o Erythema/Exudate, Mallampati score: 3  Eyes:  PERRLA, EOMI  Neck: Supple, no nuchal rigidity, no palpable LAD, bilateral neck incision c/w prior lines  Pulmonary: Sym exp, good air movt, CTAB, no rales, rhonchi, & wheezing  Cardiac: RRR, Nl S1, S2, no Murmurs, rubs or gallops  Vascular: Vessel Right Left  Radial Palpable Palpable  Ulnar Faintly Palpable Faintly Palpable  Brachial Palpable Palpable  Carotid Palpable, without bruit Palpable, without bruit  Aorta Not palpable N/A  Femoral Palpable Palpable  Popliteal Not palpable Not palpable  PT Palpable Palpable  DP Palpable Palpable   Gastrointestinal: soft, NTND, -G/R, - HSM, - masses, - CVAT B  Musculoskeletal: M/S 5/5 throughout , Extremities without ischemic changes   Neurologic: CN 2-12 intact , Pain and light touch intact in extremities , Motor exam as listed above  Psychiatric: Judgment intact, Mood & affect appropriate for pt's clinical situation  Dermatologic: See M/S exam for extremity exam, no rashes otherwise noted  Lymph : No Cervical, Axillary, or Inguinal lymphadenopathy   Non-Invasive Vascular Imaging  Vein Mapping  (Date: 05/28/2013):   R arm: acceptable vein conduits include none  L arm: acceptable vein conduits include none  BUE arterial doppler (05/28/2013)  Triphasic flow in all arteries  Only brachial arteries larger enough for use for access purposes  Outside Studies/Documentation 5 pages of outside documents were reviewed including: outpatient nephrology chart and outpatient hospital chart.  Medical Decision Making  Amy Mathews is a 53 y.o. female who presents with ESRD requiring hemodialysis.   Based on vein mapping and examination, this patient's permanent access options include: likely BUA AVG.    I would recommend: B arm and central venogram to better determine this patient's access options given the multiple scars in the neck/chest c/w central access.  The patient has agreed to proceed with the above procedure which  will be scheduled 30 Mar 15.  Leonides SakeBrian Marliss Buttacavoli, MD [facility] [place] Office: [phone] Pager: [phone]  05/28/2013, 3:27 PM

## 2013-06-16 NOTE — Anesthesia Preprocedure Evaluation (Signed)
Anesthesia Evaluation  Patient identified by MRN, date of birth, ID band Patient awake    Reviewed: Allergy & Precautions, H&P , NPO status , Patient's Chart, lab work & pertinent test results, reviewed documented beta blocker date and time   History of Anesthesia Complications Negative for: history of anesthetic complications  Airway Mallampati: III TM Distance: >3 FB Neck ROM: Full    Dental  (+) Edentulous Upper, Poor Dentition, Dental Advisory Given   Pulmonary former smoker,    Pulmonary exam normal       Cardiovascular hypertension, Pt. on medications and Pt. on home beta blockers + CAD + pacemaker  CABGx3 2006   Neuro/Psych Seizures last 2005; thought to be due to need for pacemaker insertion. No seizures with pacemaker negative psych ROS   GI/Hepatic negative GI ROS, Neg liver ROS,   Endo/Other  Hypothyroidism   Renal/GU Dialysis and ESRFRenal disease     Musculoskeletal   Abdominal   Peds  Hematology   Anesthesia Other Findings   Reproductive/Obstetrics                           Anesthesia Physical Anesthesia Plan  ASA: III  Anesthesia Plan: MAC   Post-op Pain Management:    Induction: Intravenous  Airway Management Planned: Simple Face Mask  Additional Equipment:   Intra-op Plan:   Post-operative Plan: Extubation in OR  Informed Consent: I have reviewed the patients History and Physical, chart, labs and discussed the procedure including the risks, benefits and alternatives for the proposed anesthesia with the patient or authorized representative who has indicated his/her understanding and acceptance.   Dental advisory given  Plan Discussed with: Anesthesiologist and Surgeon  Anesthesia Plan Comments:         Anesthesia Quick Evaluation

## 2013-06-16 NOTE — Progress Notes (Signed)
Notified Dr. Krista BlueSinger of patient not seeing any cardiologist since 2007 for pacemaker. [facility] [employer] rep came and interrogated ppm.

## 2013-06-16 NOTE — Anesthesia Procedure Notes (Signed)
Procedure Name: MAC Performed by: Margaree MackintoshYACOUB, Arayna Illescas B Pre-anesthesia Checklist: Patient identified, Timeout performed, Emergency Drugs available, Suction available and Patient being monitored Patient Re-evaluated:Patient Re-evaluated prior to inductionOxygen Delivery Method: Simple face mask Preoxygenation: Pre-oxygenation with 100% oxygen Placement Confirmation: positive ETCO2 Dental Injury: Teeth and Oropharynx as per pre-operative assessment

## 2013-06-16 NOTE — Op Note (Signed)
OPERATIVE NOTE   PROCEDURE: 1. left first stage brachial vein transposition (brachiobrachial arteriovenous fistula) placement  PRE-OPERATIVE DIAGNOSIS: end stage renal disease   POST-OPERATIVE DIAGNOSIS: same as above   SURGEON: Leonides SakeBrian Chen, MD  ASSISTANT(S): Doreatha MassedSamantha Rhyne, PAC   ANESTHESIA: local and MAC  ESTIMATED BLOOD LOSS: 50 cc  FINDING(S): 1. Dopplerable fistula with somewhat pulsatile character and radial signal at end of case  SPECIMEN(S):  none  INDICATIONS:   Amy Mathews is a 53 y.o. female who presents with end stage renal disease.  This patient underwent a venogram which suggested only possible fistula was a staged left brachial vein transposition.  The patient is scheduled for left first stage brachial vein transposition.  The patient is aware the risks include but are not limited to: bleeding, infection, steal syndrome, nerve damage, ischemic monomelic neuropathy, failure to mature, and need for additional procedures.  The patient is aware this is a staged procedure which requires two operations. The patient is aware of the risks of the procedure and elects to proceed forward.  DESCRIPTION: After full informed written consent was obtained from the patient, the patient was brought back to the operating room and placed supine upon the operating table.  Prior to induction, the patient received IV antibiotics.   After obtaining adequate anesthesia, the patient was then prepped and draped in the standard fashion for a left arm access procedure.  I turned my attention first to identifying the patient's brachial vein and brachial artery.  Using SonoSite guidance, the location of these vessels were marked out on the skin.   At this point, I injected local anesthetic to obtain a field block of the antecubitum.  In total, I injected about 5 mL of a 1:1 mixture of 0.5% Marcaine without epinephrine and 1% lidocaine with epinephrine.  I made a longitudinal incision at the  level of the antecubitum and dissected through the subcutaneous tissue and fascia to gain exposure of the brachial artery.  This was noted to be 3 mm in diameter externally.  This was dissected out proximally and distally and controlled with vessel loops .  I then dissected out the brachial vein.  This was noted to be 2.5-3.0 mm in diameter externally.  The distal segment of the vein was ligated with a  2-0 silk, and the vein was transected.  The proximal segment was iinterrogated with serial dilators.  The vein accepted up to a 3 mm dilator without any difficulty.  I then instilled the heparinized saline into the vein and clamped it.  At this point, I reset my exposure of the brachial artery and placed the artery under tension proximally and distally.  I made an arteriotomy with a #11 blade, and then I extended the arteriotomy with a Potts scissor.  I injected heparinized saline proximal and distal to this arteriotomy.  The vein was then sewn to the artery in an end-to-side configuration with a running stitch of 7-0 Prolene.  Prior to completing this anastomosis, I allowed the vein and artery to backbleed.  There was no evidence of clot from any vessels.  I completed the anastomosis in the usual fashion and then released all vessel loops and clamps.  There was a dopplerable thrill in the venous outflow with some pulsatile character, and there was a dopplerable radial signal.  At this point, I irrigated out the surgical wound.  There was no further active bleeding.  The subcutaneous tissue was reapproximated with a running stitch of 3-0  Vicryl.  The skin was then reapproximated with a running subcuticular stitch of 4-0 Vicryl.  The skin was then cleaned, dried, and reinforced with Dermabond.  The patient tolerated this procedure well.   COMPLICATIONS: none  CONDITION: stable  Leonides SakeBrian Chen, MD [facility] [place] Office: [phone] Pager: [phone]  06/16/2013, 11:09  AM

## 2013-06-16 NOTE — Interval H&P Note (Signed)
[facility] [place]  History and Physical Update  The patient was interviewed and re-examined.  The patient's previous History and Physical has been reviewed and is unchanged from my consult except for: interval venogram.  Based on the venogram, the right arm is not a good option due to PPM placement.  The antecubital brachial veins are the only fistula option in this patient.  Their marginal size makes the success rate limited.  At this this point, the patient is scheduled for a left staged brachial vein transposition vs AVG placement.  Risk, benefits, and alternatives to access surgery were discussed.  The patient is aware the risks include but are not limited to: bleeding, infection, steal syndrome, nerve damage, ischemic monomelic neuropathy, failure to mature, need for additional procedures, death and stroke.  The patient is aware that the left brachial vein transposition is a staged procedure.  The patient agrees to proceed forward with the procedure.  Leonides SakeBrian Kemond Amorin, MD [facility] [place] Office: [phone] Pager: [phone]  06/16/2013, 9:29 AM

## 2013-06-17 ENCOUNTER — Telehealth: Payer: Self-pay | Admitting: Internal Medicine

## 2013-06-17 ENCOUNTER — Encounter: Payer: Self-pay | Admitting: Internal Medicine

## 2013-06-17 ENCOUNTER — Encounter (HOSPITAL_COMMUNITY): Payer: Self-pay | Admitting: Vascular Surgery

## 2013-06-17 ENCOUNTER — Telehealth: Payer: Self-pay | Admitting: Vascular Surgery

## 2013-06-17 NOTE — Telephone Encounter (Addendum)
Message copied by Fredrich BirksMILLIKAN, DANA P on Thu Jun 17, 2013  2:58 PM ------      Message from: Lorin MercyMCCHESNEY, MARILYN K      Created: Wed Jun 16, 2013 12:46 PM      Regarding: Schedule                   ----- Message -----         From: Dara LordsSamantha J Rhyne, PA-C         Sent: 06/16/2013  11:16 AM           To: [facility] Charge Pool            S/p 1st stage left brachial vein transposition 06/16/13.  F/u with Dr. Imogene Burnhen in 6 weeks.  Pt does not need duplex.            Thanks,      Lelon MastSamantha ------  06/17/13: lm for pt and mailed letter, dpm

## 2013-06-17 NOTE — Telephone Encounter (Signed)
06-17-13 h# N/A, no voicemail, cell # voicemail comes in with ringing stating voicemail has not been set up yet, sent letter to pt to schedule a 3 month new pt pacemaker check with one of the EP's from 06-16-13 /mt

## 2013-06-23 ENCOUNTER — Telehealth: Payer: Self-pay | Admitting: Internal Medicine

## 2013-06-23 NOTE — Telephone Encounter (Signed)
06-17-13 home # na, no voicemail, cell # na, no voicemail, sent letter to set up EP appt for phys pacer ck in July, [facility] interrogated in hospital 06-16-13/mt

## 2013-06-28 ENCOUNTER — Telehealth: Payer: Self-pay

## 2013-06-28 NOTE — Telephone Encounter (Signed)
[facility] [place].  Reported pt. has a small knot over left arm AVF site.  This was noted while pt. dialyzed on Sat.  Stated there was no redness/ inflammation.  No drainage reported.  Described knot as "soft."  Asking if pt. needed to be evaluated in the office.  Advised will schedule appt. for eval, and if improvement noted, then appt. could be cancelled.  Agreed.

## 2013-06-29 ENCOUNTER — Encounter: Payer: Self-pay | Admitting: Vascular Surgery

## 2013-06-30 ENCOUNTER — Encounter: Payer: PRIVATE HEALTH INSURANCE | Admitting: Vascular Surgery

## 2013-06-30 DIAGNOSIS — Z0279 Encounter for issue of other medical certificate: Secondary | ICD-10-CM

## 2013-07-06 ENCOUNTER — Encounter: Payer: PRIVATE HEALTH INSURANCE | Admitting: Vascular Surgery

## 2013-07-06 ENCOUNTER — Encounter: Payer: Self-pay | Admitting: Vascular Surgery

## 2013-07-07 ENCOUNTER — Ambulatory Visit (INDEPENDENT_AMBULATORY_CARE_PROVIDER_SITE_OTHER): Payer: PRIVATE HEALTH INSURANCE | Admitting: Vascular Surgery

## 2013-07-07 ENCOUNTER — Encounter: Payer: PRIVATE HEALTH INSURANCE | Admitting: Vascular Surgery

## 2013-07-07 ENCOUNTER — Encounter: Payer: Self-pay | Admitting: Vascular Surgery

## 2013-07-07 VITALS — BP 155/73 | HR 67 | Ht 65.0 in | Wt 215.2 lb

## 2013-07-07 DIAGNOSIS — T82898A Other specified complication of vascular prosthetic devices, implants and grafts, initial encounter: Secondary | ICD-10-CM | POA: Insufficient documentation

## 2013-07-07 DIAGNOSIS — N186 End stage renal disease: Secondary | ICD-10-CM

## 2013-07-07 NOTE — Progress Notes (Signed)
   [facility]   Patient name: Amy FortsMonica J Munshi MRN: [id number] DOB: Sep 18, 1960 Sex: female  REASON FOR VISIT: Follow up of vascular access.  HPI: Amy FortsMonica J Petrakis is a 53 y.o. female who had a left first stage brachial vein transposition by Dr. Imogene Burnhen on 06/16/2013. Preoperatively, she had a bilateral upper cherry venogram on 05/31/2013, which showed that the left axillary, subclavian, and brachiocephalic veins were patent. In reviewing her preoperative vein mapping, it appeared that the brachial vein transposition was her only option for a fistula in either upper extremity.  The dialysis center was concerned about some swelling adjacent to her incision and therefore she was sent for evaluation. He states that the swelling that they were concerned about has essentially resolved. She denies fever or chills.   Past Medical History  Diagnosis Date  . Anemia   . Chronic kidney disease   . CAD (coronary artery disease)   . Thyroid disease     hypothyroidism  . Hyperlipidemia   . Hypertension   . Pacemaker   . Pneumonia   . Hypothyroidism   . Seizures     in 2006  . Headache(784.0)     migraines  . Arthritis   . History of blood transfusion    Family History  Problem Relation Age of Onset  . Hypertension Mother    SOCIAL HISTORY: History  Substance Use Topics  . Smoking status: Former Smoker    Types: Cigarettes    Quit date: 03/05/2003  . Smokeless tobacco: Never Used  . Alcohol Use: No   Allergies  Allergen Reactions  . Penicillins Hives   REVIEW OF SYSTEMS: Arly.Keller[X ] denotes positive finding; [  ] denotes negative finding  CARDIOVASCULAR:  [ ]  chest pain   [ ]  chest pressure   [ ]  palpitations   [ ]  orthopnea   [ ]  dyspnea on exertion   [ ]  claudication   [ ]  rest pain   [ ]  DVT   [ ]  phlebitis PULMONARY:   [ ]  productive cough   [ ]  asthma   [ ]  wheezing  PHYSICAL EXAM: Filed Vitals:   07/07/13 1455  BP: 155/73  Pulse: 67  Height: 5\' 5"   (1.651 m)  Weight: 215 lb 3.2 oz (97.614 kg)  SpO2: 100%   Body mass index is 35.81 kg/(m^2). GENERAL: The patient is a well-nourished female, in no acute distress. The vital signs are documented above. CARDIOVASCULAR: There is a regular rate and rhythm.  PULMONARY: There is good air exchange bilaterally without wheezing or rales. Her upper arm fistula on the left has a good thrill and bruit. The incision is healing adequately. She has a palpable left radial pulse.  MEDICAL ISSUES: Her first stage left brachial vein transposition appears to be maturing reasonably well. She's scheduled to see Dr. Imogene Burnhen in June when she'll have a duplex to determine if she is ready to undergo a second stage superficialization of her fistula. She knows to call sooner she has problems.   Chuck Hinthristopher S Nemiah Kissner [facility] [place] Beeper: [phone]

## 2013-07-15 ENCOUNTER — Encounter: Payer: Self-pay | Admitting: Internal Medicine

## 2013-08-05 ENCOUNTER — Encounter: Payer: Self-pay | Admitting: Vascular Surgery

## 2013-08-06 ENCOUNTER — Encounter: Payer: Self-pay | Admitting: Vascular Surgery

## 2013-08-06 ENCOUNTER — Ambulatory Visit (INDEPENDENT_AMBULATORY_CARE_PROVIDER_SITE_OTHER): Payer: Self-pay | Admitting: Vascular Surgery

## 2013-08-06 ENCOUNTER — Encounter (HOSPITAL_COMMUNITY): Payer: Self-pay | Admitting: Pharmacy Technician

## 2013-08-06 ENCOUNTER — Encounter (HOSPITAL_COMMUNITY): Payer: Self-pay | Admitting: *Deleted

## 2013-08-06 ENCOUNTER — Other Ambulatory Visit: Payer: Self-pay

## 2013-08-06 VITALS — BP 190/96 | HR 71 | Ht 65.0 in | Wt 218.0 lb

## 2013-08-06 DIAGNOSIS — N186 End stage renal disease: Secondary | ICD-10-CM

## 2013-08-06 NOTE — Progress Notes (Signed)
Pt denies SOB, chest pain, and being under the care of a cardiologist. Pt has a pacemaker but has not been seen by Dr.Tysinger ( cardiologist) since 2007-08. Pt stated she can't take the Labetalol the morning of surgery as instructed because she can't take it without food; it causes N/V. Pt stated that she will not take the Aspirin as instructed until after her procedure.

## 2013-08-06 NOTE — Progress Notes (Signed)
Postoperative Access Visit   History of Present Illness  Amy Mathews is a 53 y.o. year old female who presents for postoperative follow-up for: L 1st BRVT (Date: 06/16/13).  The patient's wounds are healed.  The patient notes no steal symptoms.  The patient is able to complete their activities of daily living.  The patient's current symptoms are: none.  Past Medical History  Diagnosis Date  . Anemia   . Chronic kidney disease   . CAD (coronary artery disease)   . Thyroid disease     hypothyroidism  . Hyperlipidemia   . Hypertension   . Pacemaker   . Pneumonia   . Hypothyroidism   . Seizures     in 2006  . Headache(784.0)     migraines  . Arthritis   . History of blood transfusion     Past Surgical History  Procedure Laterality Date  . Coronary artery bypass graft    . Pacemaker insertion    . Endometrial biopsy    . Renal biopsy    . Dilation and curettage of uterus    . Bascilic vein transposition Left 06/16/2013    Procedure: LEFT 1ST STAGE BRACHIAL VEIN TRANSPOSITION ;  Surgeon: Fransisco Hertz, MD;  Location: [facility] OR;  Service: Vascular;  Laterality: Left;    History   Social History  . Marital Status: Single    Spouse Name: N/A    Number of Children: N/A  . Years of Education: N/A   Occupational History  . Not on file.   Social History Main Topics  . Smoking status: Former Smoker    Types: Cigarettes    Quit date: 03/05/2003  . Smokeless tobacco: Never Used  . Alcohol Use: No  . Drug Use: No  . Sexual Activity: Not on file   Other Topics Concern  . Not on file   Social History Narrative  . No narrative on file    Family History  Problem Relation Age of Onset  . Hypertension Mother      Current Outpatient Prescriptions on File Prior to Visit  Medication Sig Dispense Refill  . allopurinol (ZYLOPRIM) 100 MG tablet Take 100 mg by mouth daily.       Marland Kitchen aspirin EC 325 MG tablet Take 325 mg by mouth daily.      . calcium acetate (PHOSLO) 667  MG capsule Take 800 mg by mouth 4 (four) times daily.       . furosemide (LASIX) 40 MG tablet Take 40 mg by mouth 2 (two) times daily.      . hydrALAZINE (APRESOLINE) 25 MG tablet Take 50 mg by mouth 2 (two) times daily.       Marland Kitchen labetalol (NORMODYNE) 300 MG tablet Take 300 mg by mouth 2 (two) times daily.      Marland Kitchen levothyroxine (SYNTHROID, LEVOTHROID) 125 MCG tablet Take 125 mcg by mouth daily before breakfast.      . levothyroxine (SYNTHROID, LEVOTHROID) 75 MCG tablet Take 75 mcg by mouth daily before breakfast.      . omeprazole (PRILOSEC) 40 MG capsule Take 40 mg by mouth at bedtime.       Marland Kitchen oxyCODONE (ROXICODONE) 5 MG immediate release tablet Take 1 tablet (5 mg total) by mouth every 6 (six) hours as needed for severe pain.  30 tablet  0  . simvastatin (ZOCOR) 20 MG tablet Take 20 mg by mouth daily.       No current facility-administered medications on file  prior to visit.    Allergies  Allergen Reactions  . Penicillins Hives    REVIEW OF SYSTEMS:  (Positives checked otherwise negative)  CARDIOVASCULAR:  [] chest pain, [] chest pressure, [] palpitations, [] shortness of breath when laying flat, [] shortness of breath with exertion,  [] pain in feet when walking, [] pain in feet when laying flat, [] history of blood clot in veins (DVT), [] history of phlebitis, [] swelling in legs, [] varicose veins  PULMONARY:  [] productive cough, [] asthma, [] wheezing  NEUROLOGIC:  [] weakness in arms or legs, [] numbness in arms or legs, [] difficulty speaking or slurred speech, [] temporary loss of vision in one eye, [] dizziness  HEMATOLOGIC:  [] bleeding problems, [] problems with blood clotting too easily  MUSCULOSKEL:  [] joint pain, [] joint swelling  GASTROINTEST:  [] vomiting blood, [] blood in stool     GENITOURINARY:  [] burning with urination, [] blood in urine  PSYCHIATRIC:  [] history of major depression  INTEGUMENTARY:  [] rashes, [] ulcers  For VQI Use Only  PRE-ADM  LIVING: Home  AMB STATUS: Ambulatory  Physical Examination Filed Vitals:   08/06/13 0835  BP: 190/96  Pulse: 71   Pulmonary: Sym exp, good air movt, CTAB, no rales, rhonchi, & wheezing  Cardiac: RRR, Nl S1, S2, no Murmurs, rubs or gallops  LUE: Incision is healed, skin feels warm, hand grip is 5/5, sensation in digits is intact, palpable strong thrill, bruit can be auscultated, On Sonosite: fistula >6 mm throughout  Medical Decision Making  Amy Mathews is a 52 y.o. year old female who presents s/p L 1st stage BRVT.  The patient is going to be scheduled for the L 2nd stage BRVT this 8 JUN 5.  Risk, benefits, and alternatives to access surgery were discussed.  The patient is aware the risks include but are not limited to: bleeding, infection, steal syndrome, nerve damage, ischemic monomelic neuropathy, failure to mature, need for additional procedures, death and stroke.    The patient agrees to proceed forward with the procedure.  Thank you for allowing us to participate in this patient's care.  Amy Sipe, MD [facility] [place] Office: [phone] Pager: [phone]  08/06/2013, 8:49 AM    

## 2013-08-08 MED ORDER — VANCOMYCIN HCL IN DEXTROSE 1-5 GM/200ML-% IV SOLN
1000.0000 mg | INTRAVENOUS | Status: AC
  Administered 2013-08-09: 1000 mg via INTRAVENOUS
  Filled 2013-08-08: qty 200

## 2013-08-09 ENCOUNTER — Ambulatory Visit (HOSPITAL_COMMUNITY): Payer: Medicare Other | Admitting: Certified Registered Nurse Anesthetist

## 2013-08-09 ENCOUNTER — Ambulatory Visit (HOSPITAL_COMMUNITY)
Admission: RE | Admit: 2013-08-09 | Discharge: 2013-08-09 | Disposition: A | Discharge status: Home / Self Care | Payer: Medicare Other | Source: Ambulatory Visit | Attending: Vascular Surgery | Admitting: Vascular Surgery

## 2013-08-09 ENCOUNTER — Encounter (HOSPITAL_COMMUNITY): Payer: Self-pay | Admitting: Certified Registered Nurse Anesthetist

## 2013-08-09 ENCOUNTER — Encounter (HOSPITAL_COMMUNITY)
Admission: RE | Disposition: A | Discharge status: Home / Self Care | Payer: Self-pay | Source: Ambulatory Visit | Attending: Vascular Surgery

## 2013-08-09 ENCOUNTER — Encounter (HOSPITAL_COMMUNITY): Payer: Medicare Other | Admitting: Certified Registered Nurse Anesthetist

## 2013-08-09 DIAGNOSIS — Z950 Presence of cardiac pacemaker: Secondary | ICD-10-CM | POA: Insufficient documentation

## 2013-08-09 DIAGNOSIS — Z87891 Personal history of nicotine dependence: Secondary | ICD-10-CM | POA: Insufficient documentation

## 2013-08-09 DIAGNOSIS — D649 Anemia, unspecified: Secondary | ICD-10-CM | POA: Insufficient documentation

## 2013-08-09 DIAGNOSIS — Z880 Allergy status to penicillin: Secondary | ICD-10-CM | POA: Insufficient documentation

## 2013-08-09 DIAGNOSIS — I2510 Atherosclerotic heart disease of native coronary artery without angina pectoris: Secondary | ICD-10-CM | POA: Insufficient documentation

## 2013-08-09 DIAGNOSIS — N186 End stage renal disease: Secondary | ICD-10-CM | POA: Insufficient documentation

## 2013-08-09 DIAGNOSIS — I120 Hypertensive chronic kidney disease with stage 5 chronic kidney disease or end stage renal disease: Secondary | ICD-10-CM | POA: Insufficient documentation

## 2013-08-09 DIAGNOSIS — E039 Hypothyroidism, unspecified: Secondary | ICD-10-CM | POA: Insufficient documentation

## 2013-08-09 DIAGNOSIS — Z7982 Long term (current) use of aspirin: Secondary | ICD-10-CM | POA: Insufficient documentation

## 2013-08-09 DIAGNOSIS — Z79899 Other long term (current) drug therapy: Secondary | ICD-10-CM | POA: Insufficient documentation

## 2013-08-09 DIAGNOSIS — M129 Arthropathy, unspecified: Secondary | ICD-10-CM | POA: Insufficient documentation

## 2013-08-09 DIAGNOSIS — E785 Hyperlipidemia, unspecified: Secondary | ICD-10-CM | POA: Insufficient documentation

## 2013-08-09 HISTORY — PX: BASCILIC VEIN TRANSPOSITION: SHX5742

## 2013-08-09 LAB — POCT I-STAT 4, (NA,K, GLUC, HGB,HCT)
Glucose, Bld: 81 mg/dL (ref 70–99)
HCT: 35 % — ABNORMAL LOW (ref 36.0–46.0)
HEMOGLOBIN: 11.9 g/dL — AB (ref 12.0–15.0)
Potassium: 5 mEq/L (ref 3.7–5.3)
SODIUM: 138 meq/L (ref 137–147)

## 2013-08-09 SURGERY — TRANSPOSITION, VEIN, BASILIC
Anesthesia: General | Site: Arm Upper | Laterality: Left

## 2013-08-09 MED ORDER — SURGIFOAM 100 EX MISC
CUTANEOUS | Status: DC | PRN
  Administered 2013-08-09 (×2): via TOPICAL

## 2013-08-09 MED ORDER — PROMETHAZINE HCL 25 MG/ML IJ SOLN: 6.2500 mg | INTRAMUSCULAR | Status: DC | PRN

## 2013-08-09 MED ORDER — CHLORHEXIDINE GLUCONATE CLOTH 2 % EX PADS: 6.0000 | MEDICATED_PAD | Freq: Once | CUTANEOUS | Status: DC

## 2013-08-09 MED ORDER — 0.9 % SODIUM CHLORIDE (POUR BTL) OPTIME
TOPICAL | Status: DC | PRN
  Administered 2013-08-09: 1000 mL

## 2013-08-09 MED ORDER — LIDOCAINE HCL (CARDIAC) 20 MG/ML IV SOLN
INTRAVENOUS | Status: AC
  Filled 2013-08-09: qty 5

## 2013-08-09 MED ORDER — PROPOFOL 10 MG/ML IV BOLUS
INTRAVENOUS | Status: DC | PRN
  Administered 2013-08-09: 20 mg via INTRAVENOUS
  Administered 2013-08-09: 140 mg via INTRAVENOUS
  Administered 2013-08-09: 40 mg via INTRAVENOUS

## 2013-08-09 MED ORDER — PHENYLEPHRINE 40 MCG/ML (10ML) SYRINGE FOR IV PUSH (FOR BLOOD PRESSURE SUPPORT)
PREFILLED_SYRINGE | INTRAVENOUS | Status: AC
  Filled 2013-08-09: qty 10

## 2013-08-09 MED ORDER — ONDANSETRON HCL 4 MG/2ML IJ SOLN
INTRAMUSCULAR | Status: DC | PRN
  Administered 2013-08-09: 4 mg via INTRAVENOUS

## 2013-08-09 MED ORDER — PHENYLEPHRINE HCL 10 MG/ML IJ SOLN
INTRAMUSCULAR | Status: DC | PRN
  Administered 2013-08-09 (×2): 120 ug via INTRAVENOUS
  Administered 2013-08-09: 80 ug via INTRAVENOUS

## 2013-08-09 MED ORDER — ARTIFICIAL TEARS OP OINT
TOPICAL_OINTMENT | OPHTHALMIC | Status: AC
  Filled 2013-08-09: qty 3.5

## 2013-08-09 MED ORDER — HYDROMORPHONE HCL PF 1 MG/ML IJ SOLN
INTRAMUSCULAR | Status: AC
  Filled 2013-08-09: qty 1

## 2013-08-09 MED ORDER — SODIUM CHLORIDE 0.9 % IV SOLN
10.0000 mg | INTRAVENOUS | Status: DC | PRN
  Administered 2013-08-09: 20 ug/min via INTRAVENOUS

## 2013-08-09 MED ORDER — BUPIVACAINE HCL (PF) 0.5 % IJ SOLN
INTRAMUSCULAR | Status: DC | PRN
Start: 2013-08-09 — End: 2013-08-09
  Administered 2013-08-09: 10 mL

## 2013-08-09 MED ORDER — SODIUM CHLORIDE 0.9 % IV SOLN
INTRAVENOUS | Status: DC
  Administered 2013-08-09: 09:00:00 via INTRAVENOUS

## 2013-08-09 MED ORDER — PHENYLEPHRINE HCL 10 MG/ML IJ SOLN
INTRAMUSCULAR | Status: AC
  Filled 2013-08-09: qty 1

## 2013-08-09 MED ORDER — FENTANYL CITRATE 0.05 MG/ML IJ SOLN
INTRAMUSCULAR | Status: DC | PRN
  Administered 2013-08-09 (×2): 25 ug via INTRAVENOUS
  Administered 2013-08-09: 100 ug via INTRAVENOUS

## 2013-08-09 MED ORDER — ONDANSETRON HCL 4 MG/2ML IJ SOLN
INTRAMUSCULAR | Status: AC
  Filled 2013-08-09: qty 2

## 2013-08-09 MED ORDER — OXYCODONE HCL 5 MG/5ML PO SOLN: 5.0000 mg | Freq: Once | ORAL | Status: AC | PRN

## 2013-08-09 MED ORDER — ALBUMIN HUMAN 5 % IV SOLN
INTRAVENOUS | Status: DC | PRN
  Administered 2013-08-09: 11:00:00 via INTRAVENOUS

## 2013-08-09 MED ORDER — THROMBIN 20000 UNITS EX SOLR
CUTANEOUS | Status: AC
  Filled 2013-08-09: qty 20000

## 2013-08-09 MED ORDER — OXYCODONE HCL 5 MG PO TABS
ORAL_TABLET | ORAL | Status: AC
  Filled 2013-08-09: qty 1

## 2013-08-09 MED ORDER — OXYCODONE HCL 5 MG PO TABS: 5.0000 mg | ORAL_TABLET | ORAL | Status: AC | PRN

## 2013-08-09 MED ORDER — MIDAZOLAM HCL 5 MG/5ML IJ SOLN
INTRAMUSCULAR | Status: DC | PRN
  Administered 2013-08-09: 2 mg via INTRAVENOUS

## 2013-08-09 MED ORDER — HYDROMORPHONE HCL PF 1 MG/ML IJ SOLN
0.2500 mg | INTRAMUSCULAR | Status: DC | PRN
  Administered 2013-08-09 (×2): 0.5 mg via INTRAVENOUS

## 2013-08-09 MED ORDER — SODIUM CHLORIDE 0.9 % IV SOLN
INTRAVENOUS | Status: DC | PRN
  Administered 2013-08-09: 10:00:00 via INTRAVENOUS

## 2013-08-09 MED ORDER — FENTANYL CITRATE 0.05 MG/ML IJ SOLN
INTRAMUSCULAR | Status: AC
  Filled 2013-08-09: qty 5

## 2013-08-09 MED ORDER — SODIUM CHLORIDE 0.9 % IR SOLN
Status: DC | PRN
  Administered 2013-08-09: 12:00:00

## 2013-08-09 MED ORDER — PROPOFOL 10 MG/ML IV BOLUS
INTRAVENOUS | Status: AC
  Filled 2013-08-09: qty 20

## 2013-08-09 MED ORDER — MIDAZOLAM HCL 2 MG/2ML IJ SOLN
INTRAMUSCULAR | Status: AC
  Filled 2013-08-09: qty 2

## 2013-08-09 MED ORDER — EPHEDRINE SULFATE 50 MG/ML IJ SOLN
INTRAMUSCULAR | Status: DC | PRN
  Administered 2013-08-09: 2.5 mg via INTRAVENOUS

## 2013-08-09 MED ORDER — LIDOCAINE-EPINEPHRINE (PF) 1 %-1:200000 IJ SOLN
INTRAMUSCULAR | Status: DC | PRN
  Administered 2013-08-09: 10 mL

## 2013-08-09 MED ORDER — LIDOCAINE HCL (CARDIAC) 10 MG/ML IV SOLN
INTRAVENOUS | Status: DC | PRN
  Administered 2013-08-09: 100 mg via INTRAVENOUS

## 2013-08-09 MED ORDER — OXYCODONE HCL 5 MG PO TABS
5.0000 mg | ORAL_TABLET | Freq: Once | ORAL | Status: AC | PRN
  Administered 2013-08-09: 5 mg via ORAL

## 2013-08-09 SURGICAL SUPPLY — 53 items
ADH SKN CLS APL DERMABOND .7 (GAUZE/BANDAGES/DRESSINGS) ×1
ADH SKN CLS LQ APL DERMABOND (GAUZE/BANDAGES/DRESSINGS) ×2
BLADE 10 SAFETY STRL DISP (BLADE) ×3 IMPLANT
CANISTER SUCTION 2500CC (MISCELLANEOUS) ×3 IMPLANT
CLIP TI MEDIUM 24 (CLIP) ×3 IMPLANT
CLIP TI WIDE RED SMALL 24 (CLIP) ×3 IMPLANT
CORDS BIPOLAR (ELECTRODE) ×2 IMPLANT
COVER PROBE W GEL 5X96 (DRAPES) ×2 IMPLANT
COVER SURGICAL LIGHT HANDLE (MISCELLANEOUS) ×3 IMPLANT
DECANTER SPIKE VIAL GLASS SM (MISCELLANEOUS) ×3 IMPLANT
DERMABOND ADHESIVE PROPEN (GAUZE/BANDAGES/DRESSINGS) ×4
DERMABOND ADVANCED (GAUZE/BANDAGES/DRESSINGS) ×2
DERMABOND ADVANCED .7 DNX12 (GAUZE/BANDAGES/DRESSINGS) ×1 IMPLANT
DERMABOND ADVANCED .7 DNX6 (GAUZE/BANDAGES/DRESSINGS) IMPLANT
ELECT REM PT RETURN 9FT ADLT (ELECTROSURGICAL) ×3
ELECTRODE REM PT RTRN 9FT ADLT (ELECTROSURGICAL) ×1 IMPLANT
GAUZE SPONGE 4X4 16PLY XRAY LF (GAUZE/BANDAGES/DRESSINGS) ×2 IMPLANT
GLOVE BIO SURGEON STRL SZ7 (GLOVE) ×3 IMPLANT
GLOVE BIOGEL PI IND STRL 6.5 (GLOVE) IMPLANT
GLOVE BIOGEL PI IND STRL 7.0 (GLOVE) IMPLANT
GLOVE BIOGEL PI IND STRL 7.5 (GLOVE) ×1 IMPLANT
GLOVE BIOGEL PI INDICATOR 6.5 (GLOVE) ×6
GLOVE BIOGEL PI INDICATOR 7.0 (GLOVE) ×2
GLOVE BIOGEL PI INDICATOR 7.5 (GLOVE) ×2
GLOVE ECLIPSE 6.5 STRL STRAW (GLOVE) ×4 IMPLANT
GLOVE SKINSENSE NS SZ7.0 (GLOVE) ×2
GLOVE SKINSENSE STRL SZ6.0 (GLOVE) ×2 IMPLANT
GLOVE SKINSENSE STRL SZ7.0 (GLOVE) IMPLANT
GOWN BRE IMP SLV AUR XL STRL (GOWN DISPOSABLE) ×2 IMPLANT
GOWN STRL REUS W/ TWL LRG LVL3 (GOWN DISPOSABLE) ×3 IMPLANT
GOWN STRL REUS W/TWL LRG LVL3 (GOWN DISPOSABLE) ×9
KIT BASIN OR (CUSTOM PROCEDURE TRAY) ×3 IMPLANT
KIT ROOM TURNOVER OR (KITS) ×3 IMPLANT
NS IRRIG 1000ML POUR BTL (IV SOLUTION) ×3 IMPLANT
PACK CV ACCESS (CUSTOM PROCEDURE TRAY) ×3 IMPLANT
PAD ARMBOARD 7.5X6 YLW CONV (MISCELLANEOUS) ×6 IMPLANT
SPONGE SURGIFOAM ABS GEL 100 (HEMOSTASIS) IMPLANT
SUT MNCRL AB 4-0 PS2 18 (SUTURE) ×5 IMPLANT
SUT PROLENE 6 0 BV (SUTURE) ×5 IMPLANT
SUT PROLENE 7 0 BV 1 (SUTURE) ×8 IMPLANT
SUT SILK 2 0 SH (SUTURE) ×3 IMPLANT
SUT SILK 3 0 (SUTURE) ×6
SUT SILK 3-0 18XBRD TIE 12 (SUTURE) IMPLANT
SUT SILK 4 0 (SUTURE) ×3
SUT SILK 4-0 18XBRD TIE 12 (SUTURE) IMPLANT
SUT VIC AB 2-0 CT1 27 (SUTURE) ×3
SUT VIC AB 2-0 CT1 TAPERPNT 27 (SUTURE) IMPLANT
SUT VIC AB 3-0 SH 27 (SUTURE) ×9
SUT VIC AB 3-0 SH 27X BRD (SUTURE) ×1 IMPLANT
TOWEL OR 17X24 6PK STRL BLUE (TOWEL DISPOSABLE) ×3 IMPLANT
TOWEL OR 17X26 10 PK STRL BLUE (TOWEL DISPOSABLE) ×3 IMPLANT
UNDERPAD 30X30 INCONTINENT (UNDERPADS AND DIAPERS) ×3 IMPLANT
WATER STERILE IRR 1000ML POUR (IV SOLUTION) ×3 IMPLANT

## 2013-08-09 NOTE — Anesthesia Postprocedure Evaluation (Signed)
  Anesthesia Post-op Note  Patient: Amy Mathews  Procedure(s) Performed: Procedure(s): SECOND STAGE BASILIC VEIN TRANSPOSITION (Left)  Patient Location: PACU  Anesthesia Type:General  Level of Consciousness: awake, alert  and oriented  Airway and Oxygen Therapy: Patient Spontanous Breathing and Patient connected to nasal cannula oxygen  Post-op Pain: moderate  Post-op Assessment: Post-op Vital signs reviewed  Post-op Vital Signs: Reviewed  Last Vitals:  Filed Vitals:   08/09/13 1420  BP: 131/68  Pulse: 63  Temp:   Resp: 19    Complications: No apparent anesthesia complications

## 2013-08-09 NOTE — H&P (View-Only) (Signed)
Postoperative Access Visit   History of Present Illness  Amy Mathews is a 53 y.o. year old female who presents for postoperative follow-up for: L 1st BRVT (Date: 06/16/13).  The patient's wounds are healed.  The patient notes no steal symptoms.  The patient is able to complete their activities of daily living.  The patient's current symptoms are: none.  Past Medical History  Diagnosis Date  . Anemia   . Chronic kidney disease   . CAD (coronary artery disease)   . Thyroid disease     hypothyroidism  . Hyperlipidemia   . Hypertension   . Pacemaker   . Pneumonia   . Hypothyroidism   . Seizures     in 2006  . Headache(784.0)     migraines  . Arthritis   . History of blood transfusion     Past Surgical History  Procedure Laterality Date  . Coronary artery bypass graft    . Pacemaker insertion    . Endometrial biopsy    . Renal biopsy    . Dilation and curettage of uterus    . Bascilic vein transposition Left 06/16/2013    Procedure: LEFT 1ST STAGE BRACHIAL VEIN TRANSPOSITION ;  Surgeon: Fransisco Hertz, MD;  Location: [facility] OR;  Service: Vascular;  Laterality: Left;    History   Social History  . Marital Status: Single    Spouse Name: N/A    Number of Children: N/A  . Years of Education: N/A   Occupational History  . Not on file.   Social History Main Topics  . Smoking status: Former Smoker    Types: Cigarettes    Quit date: 03/05/2003  . Smokeless tobacco: Never Used  . Alcohol Use: No  . Drug Use: No  . Sexual Activity: Not on file   Other Topics Concern  . Not on file   Social History Narrative  . No narrative on file    Family History  Problem Relation Age of Onset  . Hypertension Mother      Current Outpatient Prescriptions on File Prior to Visit  Medication Sig Dispense Refill  . allopurinol (ZYLOPRIM) 100 MG tablet Take 100 mg by mouth daily.       Marland Kitchen aspirin EC 325 MG tablet Take 325 mg by mouth daily.      . calcium acetate (PHOSLO) 667  MG capsule Take 800 mg by mouth 4 (four) times daily.       . furosemide (LASIX) 40 MG tablet Take 40 mg by mouth 2 (two) times daily.      . hydrALAZINE (APRESOLINE) 25 MG tablet Take 50 mg by mouth 2 (two) times daily.       Marland Kitchen labetalol (NORMODYNE) 300 MG tablet Take 300 mg by mouth 2 (two) times daily.      Marland Kitchen levothyroxine (SYNTHROID, LEVOTHROID) 125 MCG tablet Take 125 mcg by mouth daily before breakfast.      . levothyroxine (SYNTHROID, LEVOTHROID) 75 MCG tablet Take 75 mcg by mouth daily before breakfast.      . omeprazole (PRILOSEC) 40 MG capsule Take 40 mg by mouth at bedtime.       Marland Kitchen oxyCODONE (ROXICODONE) 5 MG immediate release tablet Take 1 tablet (5 mg total) by mouth every 6 (six) hours as needed for severe pain.  30 tablet  0  . simvastatin (ZOCOR) 20 MG tablet Take 20 mg by mouth daily.       No current facility-administered medications on file  prior to visit.    Allergies  Allergen Reactions  . Penicillins Hives    REVIEW OF SYSTEMS:  (Positives checked otherwise negative)  CARDIOVASCULAR:  []  chest pain, []  chest pressure, []  palpitations, []  shortness of breath when laying flat, []  shortness of breath with exertion,  []  pain in feet when walking, []  pain in feet when laying flat, []  history of blood clot in veins (DVT), []  history of phlebitis, []  swelling in legs, []  varicose veins  PULMONARY:  []  productive cough, []  asthma, []  wheezing  NEUROLOGIC:  []  weakness in arms or legs, []  numbness in arms or legs, []  difficulty speaking or slurred speech, []  temporary loss of vision in one eye, []  dizziness  HEMATOLOGIC:  []  bleeding problems, []  problems with blood clotting too easily  MUSCULOSKEL:  []  joint pain, []  joint swelling  GASTROINTEST:  []  vomiting blood, []  blood in stool     GENITOURINARY:  []  burning with urination, []  blood in urine  PSYCHIATRIC:  []  history of major depression  INTEGUMENTARY:  []  rashes, []  ulcers  For VQI Use Only  PRE-ADM  LIVING: Home  AMB STATUS: Ambulatory  Physical Examination Filed Vitals:   08/06/13 0835  BP: 190/96  Pulse: 71   Pulmonary: Sym exp, good air movt, CTAB, no rales, rhonchi, & wheezing  Cardiac: RRR, Nl S1, S2, no Murmurs, rubs or gallops  LUE: Incision is healed, skin feels warm, hand grip is 5/5, sensation in digits is intact, palpable strong thrill, bruit can be auscultated, On Sonosite: fistula >6 mm throughout  Medical Decision Making  Amy Mathews is a 53 y.o. year old female who presents s/p L 1st stage BRVT.  The patient is going to be scheduled for the L 2nd stage BRVT this 8 JUN 5.  Risk, benefits, and alternatives to access surgery were discussed.  The patient is aware the risks include but are not limited to: bleeding, infection, steal syndrome, nerve damage, ischemic monomelic neuropathy, failure to mature, need for additional procedures, death and stroke.    The patient agrees to proceed forward with the procedure.  Thank you for allowing us to participate in this patient's care.  Leonides SakeBrian Annabeth Tortora, MD [facility] [place] Office: [phone] Pager: [phone]  08/06/2013, 8:49 AM

## 2013-08-09 NOTE — Progress Notes (Signed)
Pt c/o difficulty breathing. Pt is 99% on room air, sitting in high fowlers, lung sounds clear bilat. Notified Dr. Krista Blue. No new orders --cont to monitor

## 2013-08-09 NOTE — Anesthesia Preprocedure Evaluation (Addendum)
Anesthesia Evaluation  Patient identified by MRN, date of birth, ID band Patient awake    Reviewed: Allergy & Precautions, H&P , NPO status , Patient's Chart, lab work & pertinent test results  History of Anesthesia Complications Negative for: history of anesthetic complications  Airway Mallampati: II TM Distance: >3 FB Neck ROM: Full    Dental  (+) Poor Dentition, Upper Dentures, Dental Advisory Given,    Pulmonary former smoker,          Cardiovascular hypertension, + CAD + pacemaker     Neuro/Psych  Headaches, Seizures -, Well Controlled,  negative psych ROS   GI/Hepatic negative GI ROS, Neg liver ROS,   Endo/Other  Hypothyroidism   Renal/GU ESRF and DialysisRenal disease     Musculoskeletal   Abdominal   Peds  Hematology   Anesthesia Other Findings   Reproductive/Obstetrics                         Anesthesia Physical Anesthesia Plan  ASA: III  Anesthesia Plan: General   Post-op Pain Management:    Induction: Intravenous  Airway Management Planned: LMA  Additional Equipment:   Intra-op Plan:   Post-operative Plan: Extubation in OR  Informed Consent: I have reviewed the patients History and Physical, chart, labs and discussed the procedure including the risks, benefits and alternatives for the proposed anesthesia with the patient or authorized representative who has indicated his/her understanding and acceptance.   Dental advisory given  Plan Discussed with: CRNA, Anesthesiologist and Surgeon  Anesthesia Plan Comments:        Anesthesia Quick Evaluation

## 2013-08-09 NOTE — Progress Notes (Signed)
Discussed Pacemaker status with Dr. Sport and exercise psychologist.  No need to interrogate at this time.

## 2013-08-09 NOTE — Op Note (Signed)
OPERATIVE NOTE   PROCEDURE: Left second stage brachial vein transposition (brachiobrachial arteriovenous fistula) with anastomotic revision  PRE-OPERATIVE DIAGNOSIS: successful left first stage brachial vein transposition   POST-OPERATIVE DIAGNOSIS: same as above   SURGEON: Leonides SakeBrian Chen, MD  ANESTHESIA: general  ESTIMATED BLOOD LOSS: 50 cc  FINDING(S): 1.  Sclerotic distal fistula with some difficulty palpating fistula 2.  Palpable thrill after revision of anastomosis  3.  Palpable radial pulse at end of case  SPECIMEN(S):  none  INDICATIONS:   Amy Mathews is a 53 y.o. female who presents with end stage renal disease and s/p successful left first stage brachial vein transposition.  The patient is scheduled for left second stage brachial vein transposition.  The patient is aware the risks include but are not limited to: bleeding, infection, steal syndrome, nerve damage, ischemic monomelic neuropathy, failure to mature, and need for additional procedures.  The patient is aware of the risks of the procedure and elects to proceed forward.  DESCRIPTION: After full informed written consent was obtained from the patient, the patient was brought back to the operating room and placed supine upon the operating table.  Prior to induction, the patient received IV antibiotics.   After obtaining adequate anesthesia, the patient was then prepped and draped in the standard fashion for a left arm access procedure.  I turned my attention first to identifying the patient's brachiobrachial arteriovenous fistula.  Using SonoSite guidance, the location of this fistula was marked out on the skin.  I made an longitudinal incision over the fistula from its arterial anastomosis up to its axillary extent.  I carefully dissected the fistula away from its adjacent nerves.  Eventually the entirety of this fistula was mobilized from the anastomosis to the axilla.  The distal fistula appeared sclerotic and  small.  The palpable thrill in the vein was inadequate in my opinion so I felt revision of the fistula was necessary.  I tied off the distal fistula, leaving a vein patch on the the artery.  I dissected out the brachial artery ~2 cm proximally to the prior anastomosis.  I distended the fistula with heparinized fistula.  The lumen was >6 mm throughout.  I placed the brachial artery under tension proximally and distally and an arteriotomy was made.  The brachial vein was sewn to the brachial artery in an end-to-side fashion with a running stitch of 6-0 Prolene.  Prior to completing this anastomosis, I backbled the vein and artery.  There was no clot noted.  I completed the anastomosis in the usual fashion.  At this point, I dissected a plane on top of the bicipital fascia with electrocautery.  The deep subcutaneous tissue was inspected for bleeding.  Bleeding was controlled with electrocautery and placement of large pieces of thrombin and gelfoam.  I washed out the surgical site after waiting a few minutes, and there was no further bleeding.  The deep subcutaneous tissue was reapproximated with mattress sutures of 3-0 Vicryl to eliminate some of the dead space.  The fascia was reapproximated.  The fistula was secured in its new location with loosely placed interrupt 3-0 Vicryl stitches, taking care to avoid compressing the fistula.  The superficial subcutaneous tissue was then reapproximated along the incision line with a running stitch of 3-0 Vicryl.  The skin was then reapproximated with a running subcuticular of 4-0 Monocryl.  The skin was then cleaned, dried, and reinforced with Dermabond.  The patient tolerated this procedure well.   COMPLICATIONS:  none  CONDITION: stable  Leonides Sake, MD [facility] [place] Office: [phone] Pager: [phone]  08/09/2013, 12:48 PM

## 2013-08-09 NOTE — Interval H&P Note (Signed)
History and Physical Interval Note:  08/09/2013 9:55 AM  Amy Mathews  has presented today for surgery, with the diagnosis of End stage renal disease   The various methods of treatment have been discussed with the patient and family. After consideration of risks, benefits and other options for treatment, the patient has consented to  Procedure(s): SECOND STAGE BASILIC VEIN TRANSPOSITION (Left) as a surgical intervention .  The patient's history has been reviewed, patient examined, no change in status, stable for surgery.  I have reviewed the patient's chart and labs.  Questions were answered to the patient's satisfaction.     Fransisco Hertz

## 2013-08-09 NOTE — Transfer of Care (Signed)
Immediate Anesthesia Transfer of Care Note  Patient: Amy Mathews  Procedure(s) Performed: Procedure(s): SECOND STAGE BASILIC VEIN TRANSPOSITION (Left)  Patient Location: PACU  Anesthesia Type:General  Level of Consciousness: awake and patient cooperative  Airway & Oxygen Therapy: Patient Spontanous Breathing and Patient connected to face mask oxygen  Post-op Assessment: Report given to PACU RN, Post -op Vital signs reviewed and stable and Patient moving all extremities X 4  Post vital signs: Reviewed and stable  Complications: No apparent anesthesia complications

## 2013-08-10 ENCOUNTER — Encounter (HOSPITAL_COMMUNITY): Payer: Self-pay | Admitting: Vascular Surgery

## 2013-08-19 ENCOUNTER — Telehealth: Payer: Self-pay | Admitting: Vascular Surgery

## 2013-08-19 NOTE — Telephone Encounter (Addendum)
Message copied by Fredrich BirksMILLIKAN, DANA P on Thu Aug 19, 2013  2:26 PM ------      Message from: [place], [place] K      Created: Thu Aug 19, 2013 11:22 AM      Regarding: RE: follow up       4 weeks from 08-09-13, no duplex since it's Chen      ----- Message -----         From: Fredrich Birksana P [place]         Sent: 08/19/2013  10:47 AM           To: Sharee PimpleMarilyn K McChesney, CMA      Subject: follow up                                                Garnet KoyanagiHi Kay,            Could you check this one out too? S/p L 2nd stage BVT of 08/09/13.            Thanks,      Annabelle Harmanana       ------  08/19/13: spoke with pt to schedule, dpm

## 2013-08-23 ENCOUNTER — Telehealth: Payer: Self-pay

## 2013-08-23 NOTE — Telephone Encounter (Signed)
Phone call from pt. Stated she saw her PCP on Friday, and was started on an antibiotic for infection left arm surgical site.  Stated she has "a little redness, and a lot of dry skin."  Denies any fever.  Denies drainage from incision.  Stated there is "some swelling and soreness."  Reports that [facility] nurse [facility] told her it wasn't infected.  Has not started her antibiotic yesterday.  Questioned if she should take the antibiotic that her PCP prescribed.  Advised she should follow her PCP's recommendations.  Stated she would start the antibiotic, and will call office if her symptoms don't improve or worsen.  Has appt. 09/10/13 with Dr. Imogene Burnhen.

## 2013-08-24 ENCOUNTER — Telehealth: Payer: Self-pay

## 2013-08-24 NOTE — Telephone Encounter (Signed)
Phone call from pt.  Reported she has increased swelling and soreness in the left arm incision.  Reported onset of "brown drainage" today.   Also, stated the redness is more noticeable today.  Started on an antibiotic yesterday per her PCP; reported has taken one dose.  Advised will schedule appt. for incision check; appt. given for 08/25/13 @ 3:40 PM.  Agrees with plan.

## 2013-08-25 ENCOUNTER — Ambulatory Visit: Payer: Medicare Other | Admitting: Family

## 2013-08-25 ENCOUNTER — Encounter: Payer: Self-pay | Admitting: Family

## 2013-08-31 ENCOUNTER — Encounter: Payer: Self-pay | Admitting: Family

## 2013-08-31 ENCOUNTER — Ambulatory Visit (INDEPENDENT_AMBULATORY_CARE_PROVIDER_SITE_OTHER): Payer: Self-pay | Admitting: Family

## 2013-08-31 VITALS — BP 121/70 | HR 64 | Temp 98.0°F | Resp 16 | Ht 65.0 in | Wt 223.0 lb

## 2013-08-31 DIAGNOSIS — L539 Erythematous condition, unspecified: Secondary | ICD-10-CM | POA: Insufficient documentation

## 2013-08-31 DIAGNOSIS — T148XXA Other injury of unspecified body region, initial encounter: Secondary | ICD-10-CM | POA: Insufficient documentation

## 2013-08-31 DIAGNOSIS — L24A9 Irritant contact dermatitis due friction or contact with other specified body fluids: Secondary | ICD-10-CM | POA: Insufficient documentation

## 2013-08-31 DIAGNOSIS — R200 Anesthesia of skin: Secondary | ICD-10-CM | POA: Insufficient documentation

## 2013-08-31 DIAGNOSIS — M79609 Pain in unspecified limb: Secondary | ICD-10-CM

## 2013-08-31 DIAGNOSIS — R202 Paresthesia of skin: Secondary | ICD-10-CM

## 2013-08-31 DIAGNOSIS — M79622 Pain in left upper arm: Secondary | ICD-10-CM | POA: Insufficient documentation

## 2013-08-31 DIAGNOSIS — R209 Unspecified disturbances of skin sensation: Secondary | ICD-10-CM

## 2013-08-31 NOTE — Progress Notes (Signed)
Established Dialysis Access  History of Present Illness  Amy Mathews is a 53 y.o. (1960-05-14) female patient of Dr. Imogene Burnhen who is s/p  L 1st BRVT (Date: 06/16/13) and Left second stage brachial vein transposition (brachiobrachial arteriovenous fistula) with anastomotic revision on 08/09/2013. She returns today with C/O Left arm surgical site open one day. She dialyzes T-TH-Sat., currently using a temporary catheter on the right side of her chest. The left upper arm access is her first permanent access. She denies fever or chills. Patient reports mild numbness in her left forearm, denies pain or aching in left hand or arm. She started an antibiotic yesterday, prescribed by her PCP, apparently as a prophylactic measure.  Scant serous/old blood drainage on the dressing that was removed from her left upper arm incision.   Past Medical History  Diagnosis Date  . Anemia   . Chronic kidney disease   . CAD (coronary artery disease)   . Thyroid disease     hypothyroidism  . Hyperlipidemia   . Hypertension   . Pacemaker   . Pneumonia   . Hypothyroidism   . Seizures     in 2006  . Headache(784.0)     migraines  . Arthritis   . History of blood transfusion    Past Surgical History  Procedure Laterality Date  . Coronary artery bypass graft    . Pacemaker insertion    . Endometrial biopsy    . Renal biopsy    . Dilation and curettage of uterus    . Bascilic vein transposition Left 06/16/2013    Procedure: LEFT 1ST STAGE BRACHIAL VEIN TRANSPOSITION ;  Surgeon: Fransisco HertzBrian L Chen, MD;  [facility];  Service: Vascular;  Laterality: Left;  . Colonoscopy    . Bascilic vein transposition Left 08/09/2013    Procedure: SECOND STAGE BASILIC VEIN TRANSPOSITION;  Surgeon: Fransisco HertzBrian L Chen, MD;  [facility];  Service: Vascular;  Laterality: Left;   History   Social History  . Marital Status: Single    Spouse Name: N/A    Number of Children: N/A  . Years of Education: N/A   Occupational  History  . Not on file.   Social History Main Topics  . Smoking status: Former Smoker    Types: Cigarettes    Quit date: 03/05/2003  . Smokeless tobacco: Never Used  . Alcohol Use: No  . Drug Use: No  . Sexual Activity: Not on file   Other Topics Concern  . Not on file   Social History Narrative  . No narrative on file   Family History  Problem Relation Age of Onset  . Hypertension Mother    Current Outpatient Prescriptions on File Prior to Visit  Medication Sig Dispense Refill  . allopurinol (ZYLOPRIM) 100 MG tablet Take 100 mg by mouth daily.       Marland Kitchen. aspirin EC 325 MG tablet Take 325 mg by mouth daily.      . furosemide (LASIX) 40 MG tablet Take 120 mg by mouth 2 (two) times daily.       . hydrALAZINE (APRESOLINE) 25 MG tablet Take 50 mg by mouth 2 (two) times daily.       Marland Kitchen. labetalol (NORMODYNE) 300 MG tablet Take 200 mg by mouth 2 (two) times daily.       Marland Kitchen. levothyroxine (SYNTHROID, LEVOTHROID) 75 MCG tablet Take 75 mcg by mouth daily before breakfast.      . omeprazole (PRILOSEC) 40 MG capsule Take 40  mg by mouth at bedtime.       Marland Kitchen. oxyCODONE (ROXICODONE) 5 MG immediate release tablet Take 1 tablet (5 mg total) by mouth every 4 (four) hours as needed for severe pain.  30 tablet  0  . sevelamer carbonate (RENVELA) 800 MG tablet Take 800 mg by mouth 3 (three) times daily with meals.      . simvastatin (ZOCOR) 20 MG tablet Take 20 mg by mouth daily.       No current facility-administered medications on file prior to visit.   Allergies  Allergen Reactions  . Penicillins Hives    REVIEW OF SYSTEMS: see HPI for pertinent positives and negatives.   PHYSICAL EXAMINATION:  Filed Vitals:   08/31/13 1534  BP: 121/70  Pulse: 64  Temp: 98 F (36.7 C)  TempSrc: Oral  Resp: 16  Height: 5\' 5"  (1.651 m)  Weight: 223 lb (101.152 kg)  SpO2: 99%   Body mass index is 37.11 kg/(m^2).  General: The patient appears their stated age, obese female.   HEENT:  No gross  abnormalities Pulmonary: Respirations are non-labored Abdomen: Soft and non-tender. Musculoskeletal: There are no major deformities.   Neurologic: No focal weakness or paresthesias are detected, Skin: There are no ulcer or rashes noted. Psychiatric: The patient has normal affect. Cardiovascular: There is a regular rate and rhythm without significant murmur appreciated. Bilateral radial pulses are 1+ palpable. Left upper arm fistula has an audible bruit and palpable thrill. Normal crusting and scab formation at the distal end of the surgical incision, shallow minimal separation of the incision at the proximal end, no drainage, no erythema, moderate amount of expected swelling at surgical site, no purulence, no fluctuance.  Medical Decision Making  Amy Mathews is a 53 y.o. female who presents s/p  L 1st BRVT (Date: 06/16/13) and left second stage brachial vein transposition (brachiobrachial arteriovenous fistula) with anastomotic revision on 08/09/2013.  Dr. Arbie CookeyEarly spoke with and examined patient. Proximal end of left upper arm surgical incision has separated with minimal serous/old blood drainage, no signs of infection. Finish antibiotic prescribed by her PCP. Separation at proximal aspect of incision expected to granulate and heal; cover with light clean gauze dressing daily, clean incision with soap and water daily.  Call or return for any concerns re brachiobrachial arteriovenous fistula. Return as scheduled to see Dr. Imogene Burnhen next month.   NICKEL, Carma LairSUZANNE L, RN, MSN, FNP-C [facility] [place] Office: [phone]  08/31/2013, 3:42 PM  Clinic MD: Early

## 2013-08-31 NOTE — Patient Instructions (Signed)
Vascular Access for Hemodialysis A vascular access is a connection between two blood vessels that allows blood to be easily removed from the body and returned to the body during hemodialysis. Hemodialysis is a procedure in which a machine outside of the body filters the blood. There are three types of vascular accesses:   Arteriovenous fistula. This is a connection between an artery and a vein (usually in the arm) that is made by sewing them together. Blood in the artery flows directly into the vein, causing it to get larger over time. This makes it easier for the vein to be used for hemodialysis. An arteriovenous fistula takes 1-6 months to develop after surgery.   Arteriovenous graft. This is a connection between an artery and a vein in the arm that is made with a tube. An arteriovenous graft can be used within 2-3 weeks of surgery.   Venous catheter. This is a thin, flexible tube that is placed in a large vein (usually in the neck, chest, or groin). A venous catheter for hemodialysis contains two tubes that come out of the skin. A venous catheter can be used right away. It is usually used as a temporary access if you need hemodialysis before a fistula or graft has developed. It may also be used as a permanent access if a fistula or graft cannot be created. WHICH TYPE OF ACCESS IS BEST FOR ME? The type of access that is best for you depends on the size and strength of your veins.  A fistula is usually the preferred type of access. It can last several years and is less likely than the other types of accesses to become infected or to cause blood clots within a blood vessel (thrombosis). However, a fistula is not an option for everyone. If your veins are not the right size, a graft may be used instead. Grafts require you to have strong veins. If your veins are not strong enough for a graft, a catheter may be used. Catheters are more likely than fistulas and grafts to become infected or to have thrombosis.   Sometimes, only one type of access is an option. Your health care provider will help you determine which type of access is best for you.  HOW IS A VASCULAR ACCESS USED? The way the access is used depends on the type of access:   If the access is a fistula or graft, two needles are inserted through the skin into the access before each hemodialysis session. Blood leaves the body through one of the needles and travels through a tube to the hemodialysis machine (dialyzer). It then flows through another tube and returns to the body through the second needle.   If the access is a catheter, one tube is connected directly to the tube that leads to the dialyzer and the other is connected to a tube that leads away from the dialyzer. Blood leaves the body through one tube and returns to the body through the other.  WHAT KIND OF PROBLEMS CAN OCCUR WITH VASCULAR ACCESSES?  Blood clots within a blood vessel (thrombosis). Thrombosis can lead to a narrowing of a blood vessel or tube (stenosis). If thrombosis occurs frequently, another access site may be created as a backup.   Infection.  These problems are most likely to occur with a venous catheter and least likely to occur with an arteriovenous fistula.  HOW DO I CARE FOR MY VASCULAR ACCESS? Wear a medical alert bracelet. This tells health care providers that you are   a dialysis patient in the case of an emergency and allows them to care for your veins appropriately. If you have a graft or fistula:   A "bruit" is a noise that is heard with a stethoscope and a "thrill" is a vibration felt over the graft or fistula. The presence of the bruit and thrill indicates that the access is working. You will be taught to feel for the thrill each day. If this is not felt, the access may be clotted. Call your health care provider.   You may use the arm where your vascular access is located freely after the site heals. Keep the following in mind:   Avoid pressure on  the arm.   Avoid lifting heavy objects with the arm.   Avoid sleeping on the arm.   Avoid wearing tight-sleeved shirts or jewelry around the graft or fistula.   Do not allow blood pressure monitoring or needle punctures on the side where the graft or fistula is located.   With permission from your health care provider, you may do exercises to help with blood flow through a fistula. These exercises involve squeezing a rubber ball or other soft objects as instructed. SEEK MEDICAL CARE IF:   Chills develop.   You have an oral temperature above 102 F (38.9 C).  Swelling around the graft or fistula gets worse.   New pain develops.   Pus or other fluid (drainage) is seen at the vascular access site.   Skin redness or red streaking is seen on the skin around, above, or below the vascular access. SEEK IMMEDIATE MEDICAL CARE IF:   Pain, numbness, or an unusual pale skin color develops in the hand on the side of your fistula.   Dizziness or weakness develops that you have not had before.   The vascular access has bleeding that cannot be easily controlled. Document Released: 05/11/2002 Document Revised: 02/23/2013 Document Reviewed: 07/07/2012 ExitCare Patient Information 2015 [employer]. This information is not intended to replace advice given to you by your health care provider. Make sure you discuss any questions you have with your health care provider.  

## 2013-09-08 ENCOUNTER — Encounter: Payer: Self-pay | Admitting: Vascular Surgery

## 2013-09-08 ENCOUNTER — Other Ambulatory Visit: Payer: Self-pay | Admitting: *Deleted

## 2013-09-08 ENCOUNTER — Ambulatory Visit (INDEPENDENT_AMBULATORY_CARE_PROVIDER_SITE_OTHER): Payer: Self-pay | Admitting: Vascular Surgery

## 2013-09-08 VITALS — BP 165/76 | HR 67 | Ht 65.0 in | Wt 225.0 lb

## 2013-09-08 DIAGNOSIS — N186 End stage renal disease: Secondary | ICD-10-CM

## 2013-09-08 NOTE — Progress Notes (Signed)
    Postoperative Access Visit   History of Present Illness  Amy Mathews is a 53 y.o. year old female who presents for postoperative follow-up for: L 2nd BRVT (Date: 08/09/13).  The patient's wounds are healing.  She continues to drain serosang drainage from a central area of separation.  The patient notes no steal symptoms.  The patient is able to complete their activities of daily living.  The patient's current symptoms are: drainage.  For VQI Use Only  PRE-ADM LIVING: Home  AMB STATUS: Ambulatory  Physical Examination Filed Vitals:   09/08/13 1616  BP: 165/76  Pulse: 67    LUE: Incision is healed except for a proximal and distal patch of subcutaneous tissue in the process of re-epithelialization, there is a central shallow defect ~ quarter size with dried fat visible, fistula is covered and easily palpable, skin feels warm without any erythema or TTP to light tought, hand grip is 5/5, sensation in digits is intact, palpable thrill, bruit can be auscultated   Medical Decision Making  Amy FortsMonica J Mathews is a 53 y.o. 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  I have arranged for wet-to-dry dressings to L arm wound: M-W-F.  The patient will get dressing changes at HD on T-R-S.  I suspect the wounds will be healed in the next 2-4 weeks.  The pt will follow up in the office in 2 weeks for wound check.  Thank you for allowing us to participate in this patient's care.  Leonides SakeBrian Chen, MD [facility] [place] [facility] Office: [phone] Pager: [phone]  09/08/2013, 5:10 PM

## 2013-09-09 ENCOUNTER — Other Ambulatory Visit: Payer: Self-pay | Admitting: *Deleted

## 2013-09-09 DIAGNOSIS — N186 End stage renal disease: Secondary | ICD-10-CM

## 2013-09-10 ENCOUNTER — Encounter: Payer: Medicare Other | Admitting: Vascular Surgery

## 2013-09-23 ENCOUNTER — Encounter: Payer: Self-pay | Admitting: Vascular Surgery

## 2013-09-24 ENCOUNTER — Encounter: Payer: Self-pay | Admitting: Vascular Surgery

## 2013-09-24 ENCOUNTER — Ambulatory Visit (INDEPENDENT_AMBULATORY_CARE_PROVIDER_SITE_OTHER): Payer: Self-pay | Admitting: Vascular Surgery

## 2013-09-24 VITALS — BP 185/87 | HR 74 | Ht 65.0 in | Wt 224.9 lb

## 2013-09-24 DIAGNOSIS — N186 End stage renal disease: Secondary | ICD-10-CM

## 2013-09-24 NOTE — Progress Notes (Signed)
   Postoperative Access Visit   History of Present Illness  Amy Mathews is a 53 y.o. female who presents for postoperative follow-up for: L 2nd BRVT (Date: 08/09/13). The patient's wounds are near healed. The previous drainage has stopped.  The patient notes no steal symptoms. The patient is able to complete their activities of daily living. The patient's current symptoms are: none  For VQI Use Only   PRE-ADM LIVING: Home   AMB STATUS: Ambulatory  Physical Examination  Filed Vitals:   09/24/13 0831  BP: 185/87  Pulse: 74  Height: 5\' 5"  (1.651 m)  Weight: 224 lb 14.4 oz (102.014 kg)  SpO2: 100%   LUE: prior defect is healed, only two areas of scab remain, fistula is fully covered and easily palpable, skin feels warm without any erythema or TTP to light tought, hand grip is 5/5, sensation in digits is intact, palpable thrill, bruit can be auscultated   Medical Decision Making  Amy Mathews is a 53 y.o. 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  I would start using the L 2nd stage BRVT beginning of August.  I think the extra time will let the scar soften up and make it easier for cannulation. Thank you for allowing us to participate in this patient's care.  Leonides SakeBrian Chen, MD [facility] [place] Office: [phone] Pager: [phone]  09/24/2013, 8:42 AM

## 2013-10-12 ENCOUNTER — Other Ambulatory Visit (HOSPITAL_COMMUNITY): Payer: Self-pay | Admitting: Nephrology

## 2013-10-12 DIAGNOSIS — N289 Disorder of kidney and ureter, unspecified: Secondary | ICD-10-CM

## 2013-10-15 ENCOUNTER — Ambulatory Visit (HOSPITAL_COMMUNITY)
Admission: RE | Admit: 2013-10-15 | Discharge: 2013-10-15 | Disposition: A | Discharge status: Home / Self Care | Payer: Medicare Other | Source: Ambulatory Visit | Attending: Nephrology | Admitting: Nephrology

## 2013-10-15 VITALS — BP 181/88 | HR 66 | Resp 18 | Wt 224.9 lb

## 2013-10-15 DIAGNOSIS — Z4901 Encounter for fitting and adjustment of extracorporeal dialysis catheter: Secondary | ICD-10-CM | POA: Diagnosis not present

## 2013-10-15 DIAGNOSIS — N186 End stage renal disease: Secondary | ICD-10-CM | POA: Insufficient documentation

## 2013-10-15 DIAGNOSIS — N289 Disorder of kidney and ureter, unspecified: Secondary | ICD-10-CM

## 2013-10-15 DIAGNOSIS — Z992 Dependence on renal dialysis: Secondary | ICD-10-CM | POA: Diagnosis not present

## 2013-10-15 MED ORDER — SODIUM CHLORIDE 0.9 % IV SOLN
30.0000 ug | Freq: Once | INTRAVENOUS | Status: AC
  Administered 2013-10-15: 30 ug via INTRAVENOUS
  Filled 2013-10-15: qty 7.5

## 2013-10-15 MED ORDER — LIDOCAINE HCL 1 % IJ SOLN
INTRAMUSCULAR | Status: AC
  Filled 2013-10-15: qty 20

## 2013-10-15 MED ORDER — GELATIN ABSORBABLE 12-7 MM EX MISC
CUTANEOUS | Status: AC
  Filled 2013-10-15: qty 1

## 2013-10-15 NOTE — Sedation Documentation (Signed)
Sedation Narrator used for charting.  Using narrator for charting.

## 2013-10-15 NOTE — Progress Notes (Signed)
In Radiology Nurses Station post HD pull.  Pressure held X 50 minutes. Iv started, DDAVP begun. VSS.  Explanation of events and meds given, pt verbalizes understanding.

## 2013-10-15 NOTE — Sedation Documentation (Signed)
DDAVP completed.  Right chest dressing remains CDI, no evidence of any bleeding.  Instructed to rest with HOB up today with no strenuous activity.  To keep dressing on X 48 hrs, will have dialysis check tomorrow.  Instructed to hold pressure and return to ER for any recurrent bleeding.  Pt and family verbalize understanding.  DC via WC

## 2013-10-15 NOTE — Procedures (Signed)
Procedure:  Perm cath removal Tunneled HD cath in right chest removed in entirety after sharp and blunt dissection. No complications. 

## 2013-11-01 ENCOUNTER — Encounter: Payer: Self-pay | Admitting: Surgery

## 2013-11-01 ENCOUNTER — Ambulatory Visit (INDEPENDENT_AMBULATORY_CARE_PROVIDER_SITE_OTHER): Payer: Medicare Other | Admitting: Surgery

## 2013-11-01 VITALS — BP 169/82 | HR 73 | Ht 65.0 in | Wt 226.0 lb

## 2013-11-01 DIAGNOSIS — N186 End stage renal disease: Secondary | ICD-10-CM

## 2013-11-01 DIAGNOSIS — T82898A Other specified complication of vascular prosthetic devices, implants and grafts, initial encounter: Secondary | ICD-10-CM

## 2013-11-01 NOTE — Progress Notes (Signed)
Patient name: Amy Mathews MRN: [id number] DOB: Jul 05, 1960 Sex: female     Chief Complaint  Patient presents with  . Re-evaluation    possible ligation     HISTORY OF PRESENT ILLNESS: The patient is status post staged basilic vein transposition.  The second procedure was performed by Dr. Imogene Burn on 08/09/2013.  The patient began using the access and early August but has had difficulty.  She has a catheter in place.  She reports having a fistula study this weekend.  She denies hand pain or swelling.  Past Medical History  Diagnosis Date  . Anemia   . Chronic kidney disease   . CAD (coronary artery disease)   . Thyroid disease     hypothyroidism  . Hyperlipidemia   . Hypertension   . Pacemaker   . Pneumonia   . Hypothyroidism   . Seizures     in 2006  . Headache(784.0)     migraines  . Arthritis   . History of blood transfusion     Past Surgical History  Procedure Laterality Date  . Coronary artery bypass graft    . Pacemaker insertion    . Endometrial biopsy    . Renal biopsy    . Dilation and curettage of uterus    . Bascilic vein transposition Left 06/16/2013    Procedure: LEFT 1ST STAGE BRACHIAL VEIN TRANSPOSITION ;  Surgeon: Fransisco Hertz, MD;  Location: [facility] OR;  Service: Vascular;  Laterality: Left;  . Colonoscopy    . Bascilic vein transposition Left 08/09/2013    Procedure: SECOND STAGE BASILIC VEIN TRANSPOSITION;  Surgeon: Fransisco Hertz, MD;  Location: [facility] OR;  Service: Vascular;  Laterality: Left;    History   Social History  . Marital Status: Single    Spouse Name: N/A    Number of Children: N/A  . Years of Education: N/A   Occupational History  . Not on file.   Social History Main Topics  . Smoking status: Former Smoker    Types: Cigarettes    Quit date: 03/05/2003  . Smokeless tobacco: Never Used  . Alcohol Use: No  . Drug Use: No  . Sexual Activity: Not on file   Other Topics Concern  . Not on file   Social History Narrative  . No  narrative on file    Family History  Problem Relation Age of Onset  . Hypertension Mother     Allergies as of 11/01/2013 - Review Complete 11/01/2013  Allergen Reaction Noted  . Penicillins Hives 05/21/2013    Current Outpatient Prescriptions on File Prior to Visit  Medication Sig Dispense Refill  . allopurinol (ZYLOPRIM) 100 MG tablet Take 100 mg by mouth daily.       Marland Kitchen aspirin EC 325 MG tablet Take 325 mg by mouth daily.      . ciprofloxacin (CIPRO) 500 MG tablet 2 (two) times daily.      . furosemide (LASIX) 40 MG tablet Take 120 mg by mouth 2 (two) times daily.       . hydrALAZINE (APRESOLINE) 25 MG tablet Take 50 mg by mouth 2 (two) times daily.       Marland Kitchen labetalol (NORMODYNE) 200 MG tablet       . labetalol (NORMODYNE) 300 MG tablet Take 200 mg by mouth 2 (two) times daily.       Marland Kitchen levothyroxine (SYNTHROID, LEVOTHROID) 75 MCG tablet Take 75 mcg by mouth daily before breakfast.      .  omeprazole (PRILOSEC) 40 MG capsule Take 40 mg by mouth at bedtime.       Marland Kitchen oxyCODONE (ROXICODONE) 5 MG immediate release tablet Take 1 tablet (5 mg total) by mouth every 4 (four) hours as needed for severe pain.  30 tablet  0  . sevelamer carbonate (RENVELA) 800 MG tablet Take 800 mg by mouth 3 (three) times daily with meals.      . simvastatin (ZOCOR) 20 MG tablet Take 20 mg by mouth daily.       No current facility-administered medications on file prior to visit.      PHYSICAL EXAMINATION:   Vital signs are BP 169/82  Pulse 73  Ht  (1.651 m)  Wt 226 lb (102.513 kg)  BMI 37.61 kg/m2  SpO2 96% General: The patient appears their stated age. HEENT:  No gross abnormalities Pulmonary:  Non labored breathing Skin: There are no ulcer or rashes noted. Cardiovascular: Palpable thrill within left upper arm fistula   Diagnostic Studies None  Assessment: End-stage renal disease Plan: I discussed with the patient that I not sure why she is here because I do not have a copy of her  images.  I don't want to make any decisions without seeing the pictures.  I tried to get in touch with Dr. Juel Burrow was unable to.  The patient is agreeable to get the images and come back Friday for additional discussions.  Jorge Ny, M.D. [facility] [place] Office: [phone] Pager:  [phone]

## 2013-11-02 ENCOUNTER — Encounter: Payer: Self-pay | Admitting: Vascular Surgery

## 2013-11-03 ENCOUNTER — Encounter: Payer: Self-pay | Admitting: Vascular Surgery

## 2013-11-03 ENCOUNTER — Ambulatory Visit (HOSPITAL_COMMUNITY)
Admission: RE | Admit: 2013-11-03 | Discharge: 2013-11-03 | Disposition: A | Discharge status: Home / Self Care | Payer: Medicare Other | Source: Ambulatory Visit | Attending: Vascular Surgery | Admitting: Vascular Surgery

## 2013-11-03 ENCOUNTER — Ambulatory Visit (INDEPENDENT_AMBULATORY_CARE_PROVIDER_SITE_OTHER): Payer: Medicare Other | Admitting: Vascular Surgery

## 2013-11-03 VITALS — BP 175/81 | HR 74 | Ht 65.0 in | Wt 229.0 lb

## 2013-11-03 DIAGNOSIS — I868 Varicose veins of other specified sites: Secondary | ICD-10-CM | POA: Diagnosis not present

## 2013-11-03 DIAGNOSIS — N186 End stage renal disease: Secondary | ICD-10-CM

## 2013-11-03 DIAGNOSIS — Z4931 Encounter for adequacy testing for hemodialysis: Secondary | ICD-10-CM

## 2013-11-03 DIAGNOSIS — T82898A Other specified complication of vascular prosthetic devices, implants and grafts, initial encounter: Secondary | ICD-10-CM

## 2013-11-03 NOTE — Addendum Note (Signed)
Addended by: Sharee Pimple on: 11/03/2013 04:27 PM   Modules accepted: Orders

## 2013-11-03 NOTE — Assessment & Plan Note (Signed)
The patient's left basilic vein transposition is patent and appears to be flowing well except that there are 2 small pseudoaneurysms associated with this. Given that the fistula has an excellent thrill, I would be reluctant to give up on the fistula at this point. I think there is a good chance that the pseudoaneurysms will potentially thrombose and that the fistula could still be used in the future. However I recommended that he not use the fistula for approximately 3 weeks at which time we'll repeat her duplex. They can use her right IJ tunneled dialysis catheter. I did discuss the case over the phone today with Dr. Paulene Floor. We both agree that this is a reasonable approach. If the fistula is not functional after a period of rest, then she would require placement of new access.

## 2013-11-03 NOTE — Progress Notes (Signed)
   Patient name: Amy Mathews MRN: [id number] DOB: Feb 26, 1961 Sex: female  REASON FOR VISIT: to evaluate left AV fistula  HPI: Amy Mathews is a 53 y.o. female who was last seen in our office 2 days ago by Dr. Myra Gianotti. She has undergone a basilic vein transposition by Dr. Leonides Sake. The second stage was done on 08/09/2013. The fistula had not been working well. She has a catheter in place. There were no images from her recent fistulogram to review and therefore she comes back today with a CD of the fistulogram that was performed.  I reviewed her fistulogram. There are not a lot of images to review. One run shows that the fistula is patent with perhaps some narrowing in the central portion over a fairly long distance. I do not have the operative report to review. However, according to the patient the fistula was difficult to access and "balloon" could not be done.  She dialyzes on Tuesdays Thursdays and Saturdays. She has a functioning right IJ catheter. Her only complaint is that when she removes the dressings after dialysis she has some bleeding. There is no pain or paresthesias in the left arm.  REVIEW OF SYSTEMS: Arly.Keller ] denotes positive finding; [  ] denotes negative finding  CARDIOVASCULAR:   chest pain    dyspnea on exertion    CONSTITUTIONAL:   fever    chills  PHYSICAL EXAM: Filed Vitals:   11/03/13 1102  BP: 175/81  Pulse: 74  Height:  (1.651 m)  Weight: 229 lb (103.874 kg)  SpO2: 100%   Body mass index is 38.11 kg/(m^2). GENERAL: The patient is a well-nourished female, in no acute distress. The vital signs are documented above. CARDIOVASCULAR: There is a regular rate and rhythm. PULMONARY: There is good air exchange bilaterally without wheezing or rales. On my exam, she has an excellent thrill in the left upper arm fistula. The fistula is not pulsatile. It feels very superficial.  DUPLEX: I obtained a duplex of her fistula today which shows 2 small  pseudoaneurysms of her basilic vein transposition.  MEDICAL ISSUES:  End stage renal disease The patient's left basilic vein transposition is patent and appears to be flowing well except that there are 2 small pseudoaneurysms associated with this. Given that the fistula has an excellent thrill, I would be reluctant to give up on the fistula at this point. I think there is a good chance that the pseudoaneurysms will potentially thrombose and that the fistula could still be used in the future. However I recommended that he not use the fistula for approximately 3 weeks at which time we'll repeat her duplex. They can use her right IJ tunneled dialysis catheter. I did discuss the case over the phone today with Dr. Paulene Floor. We both agree that this is a reasonable approach. If the fistula is not functional after a period of rest, then she would require placement of new access.   Keylan Costabile S [facility] [place] [facility]: [phone]

## 2013-12-02 ENCOUNTER — Encounter: Payer: Self-pay | Admitting: Vascular Surgery

## 2013-12-03 ENCOUNTER — Other Ambulatory Visit: Payer: Self-pay | Admitting: Vascular Surgery

## 2013-12-03 ENCOUNTER — Encounter: Payer: Self-pay | Admitting: Vascular Surgery

## 2013-12-03 ENCOUNTER — Ambulatory Visit (HOSPITAL_COMMUNITY)
Admission: RE | Admit: 2013-12-03 | Discharge: 2013-12-03 | Disposition: A | Discharge status: Home / Self Care | Payer: Medicare Other | Source: Ambulatory Visit | Attending: Vascular Surgery | Admitting: Vascular Surgery

## 2013-12-03 ENCOUNTER — Ambulatory Visit (INDEPENDENT_AMBULATORY_CARE_PROVIDER_SITE_OTHER): Payer: Medicare Other | Admitting: Vascular Surgery

## 2013-12-03 VITALS — BP 178/93 | HR 85 | Ht 65.0 in | Wt 233.0 lb

## 2013-12-03 DIAGNOSIS — Z4931 Encounter for adequacy testing for hemodialysis: Secondary | ICD-10-CM | POA: Insufficient documentation

## 2013-12-03 DIAGNOSIS — N186 End stage renal disease: Secondary | ICD-10-CM | POA: Insufficient documentation

## 2013-12-03 DIAGNOSIS — I868 Varicose veins of other specified sites: Secondary | ICD-10-CM | POA: Diagnosis not present

## 2013-12-03 NOTE — Progress Notes (Signed)
    Established Dialysis Access  History of Present Illness  Amy Mathews is a 53 y.o. (Nov 18, 1960) female who presents for re-evaluation of Left arm BVT.  Reportedly she has successfully completed runs with the L arm fistula but also had some significant infilitration of the fistula and abnormal fistulogram afterward.  Dr. Edilia Boickson felt the fistula need to be rested to recover from the infilitration.  The patient denies any steal sx.    The patient's PMH, PSH, SH, FamHx, Med, and Allergies are unchanged from 11/03/13.  On ROS today: no steal sx, no neurologic sx in the Left arm  Physical Examination  Filed Vitals:   12/03/13 1041  BP: 178/93  Pulse: 85  Height: 5\' 5"  (1.651 m)  Weight: 233 lb (105.688 kg)  SpO2: 99%   Body mass index is 38.77 kg/(m^2).  General: A&O x 3, WD, WN  Pulmonary: Sym exp, good air movt, CTAB, no rales, rhonchi, & wheezing  Cardiac: RRR, Nl S1, S2, no Murmurs, rubs or gallops  Vascular: Vessel Right Left  Radial  Palpable Faintly Palpable  Ulnar Not Palpable Not Palpable  Brachial Palpable Palpable   Gastrointestinal: soft, NTND, -G/R, - HSM, - masses, - CVAT B  Musculoskeletal: M/S 5/5 throughout , Extremities without  ischemic changes , easily palpable thrill, + bruit in access, two small pseudoaneurysms evident  Neurologic: Pain and light touch intact in extremities , Motor exam as listed above  Non-Invasive Vascular Imaging  Left arm Access Duplex  (Date: 12/03/2013):   Diameters:  >6 mm through cm  Depth:  < 4 mm throughout most, proximal is 7.6 mm  Two pseudoaneurysm: 1.8 cm x 1.9 cm, 0.9 cm x 1.2 cm   Medical Decision Making  Amy Mathews is a 53 y.o. female who presents with ESRD requiring hemodialysis   Fistulogram completed in the setting of infilitrations are essentially useless as they only demonstrate compression of the fistula by surrounding hematoma.  Pt has a functional fistula that is <6 mm deep complicated by  two iatrogenic PSA caused by poor cannulation technique.  I would not give up on this fistula, as there are no further fistula options in this arm.  An AVG will not be any more shallow than this functional fistula.  I suspect a button hole technique is going to be necessary in this patient.  Amy SakeBrian Adra Shepler, MD [facility] [place] Office: [phone] Pager: [phone]  12/03/2013, 11:40 AM

## 2013-12-13 ENCOUNTER — Other Ambulatory Visit (HOSPITAL_COMMUNITY): Payer: Self-pay | Admitting: Nephrology

## 2013-12-13 DIAGNOSIS — N186 End stage renal disease: Secondary | ICD-10-CM

## 2013-12-13 DIAGNOSIS — Z992 Dependence on renal dialysis: Principal | ICD-10-CM

## 2013-12-16 ENCOUNTER — Inpatient Hospital Stay (HOSPITAL_COMMUNITY): Admission: RE | Admit: 2013-12-16 | Payer: Medicare Other | Source: Ambulatory Visit

## 2013-12-22 ENCOUNTER — Ambulatory Visit (HOSPITAL_COMMUNITY)
Admission: RE | Admit: 2013-12-22 | Discharge: 2013-12-22 | Disposition: A | Discharge status: Home / Self Care | Payer: Medicare Other | Source: Ambulatory Visit | Attending: Nephrology | Admitting: Nephrology

## 2013-12-22 DIAGNOSIS — Z992 Dependence on renal dialysis: Secondary | ICD-10-CM

## 2013-12-22 DIAGNOSIS — Z452 Encounter for adjustment and management of vascular access device: Secondary | ICD-10-CM | POA: Diagnosis present

## 2013-12-22 DIAGNOSIS — N186 End stage renal disease: Secondary | ICD-10-CM

## 2013-12-22 MED ORDER — CHLORHEXIDINE GLUCONATE 4 % EX LIQD
CUTANEOUS | Status: AC
  Filled 2013-12-22: qty 15

## 2013-12-22 MED ORDER — LIDOCAINE HCL 1 % IJ SOLN
INTRAMUSCULAR | Status: DC
Start: 2013-12-22 — End: 2013-12-23
  Filled 2013-12-22: qty 20

## 2013-12-22 NOTE — Progress Notes (Signed)
Dressing site dry and intact, patient sent home.

## 2013-12-22 NOTE — Progress Notes (Signed)
Request for right IJ tunneled HD catheter removal. LUE fistula working well and (+) bruit. Successful removal of right IJ tunneled HD catheter with no immediate complications. Vaseline gauze dressing applied, patient given discharge instructions.  Pattricia BossKoreen Dalylah Ramey PA-C Interventional Radiology  12/22/13  1:59 PM

## 2014-02-10 ENCOUNTER — Encounter (HOSPITAL_COMMUNITY): Payer: Self-pay | Admitting: Vascular Surgery

## 2014-04-13 ENCOUNTER — Other Ambulatory Visit: Payer: Self-pay

## 2014-04-13 ENCOUNTER — Encounter (HOSPITAL_COMMUNITY): Payer: Self-pay | Admitting: Pharmacy Technician

## 2014-04-18 ENCOUNTER — Ambulatory Visit (HOSPITAL_COMMUNITY)
Admission: RE | Admit: 2014-04-18 | Discharge: 2014-04-18 | Disposition: A | Discharge status: Home / Self Care | Payer: Medicare Other | Source: Ambulatory Visit | Attending: Vascular Surgery | Admitting: Vascular Surgery

## 2014-04-18 ENCOUNTER — Encounter (HOSPITAL_COMMUNITY): Payer: Self-pay | Admitting: Vascular Surgery

## 2014-04-18 ENCOUNTER — Encounter (HOSPITAL_COMMUNITY)
Admission: RE | Disposition: A | Discharge status: Home / Self Care | Payer: Self-pay | Source: Ambulatory Visit | Attending: Vascular Surgery

## 2014-04-18 ENCOUNTER — Other Ambulatory Visit: Payer: Self-pay | Admitting: *Deleted

## 2014-04-18 DIAGNOSIS — G43909 Migraine, unspecified, not intractable, without status migrainosus: Secondary | ICD-10-CM | POA: Insufficient documentation

## 2014-04-18 DIAGNOSIS — I2510 Atherosclerotic heart disease of native coronary artery without angina pectoris: Secondary | ICD-10-CM | POA: Diagnosis not present

## 2014-04-18 DIAGNOSIS — I129 Hypertensive chronic kidney disease with stage 1 through stage 4 chronic kidney disease, or unspecified chronic kidney disease: Secondary | ICD-10-CM | POA: Diagnosis not present

## 2014-04-18 DIAGNOSIS — Z951 Presence of aortocoronary bypass graft: Secondary | ICD-10-CM | POA: Diagnosis not present

## 2014-04-18 DIAGNOSIS — N186 End stage renal disease: Secondary | ICD-10-CM | POA: Diagnosis present

## 2014-04-18 DIAGNOSIS — Z4931 Encounter for adequacy testing for hemodialysis: Secondary | ICD-10-CM

## 2014-04-18 DIAGNOSIS — M1990 Unspecified osteoarthritis, unspecified site: Secondary | ICD-10-CM | POA: Insufficient documentation

## 2014-04-18 DIAGNOSIS — E039 Hypothyroidism, unspecified: Secondary | ICD-10-CM | POA: Diagnosis not present

## 2014-04-18 DIAGNOSIS — Y832 Surgical operation with anastomosis, bypass or graft as the cause of abnormal reaction of the patient, or of later complication, without mention of misadventure at the time of the procedure: Secondary | ICD-10-CM | POA: Diagnosis not present

## 2014-04-18 DIAGNOSIS — Z87891 Personal history of nicotine dependence: Secondary | ICD-10-CM | POA: Diagnosis not present

## 2014-04-18 DIAGNOSIS — N189 Chronic kidney disease, unspecified: Secondary | ICD-10-CM | POA: Insufficient documentation

## 2014-04-18 DIAGNOSIS — T82858A Stenosis of vascular prosthetic devices, implants and grafts, initial encounter: Secondary | ICD-10-CM | POA: Insufficient documentation

## 2014-04-18 DIAGNOSIS — E785 Hyperlipidemia, unspecified: Secondary | ICD-10-CM | POA: Diagnosis not present

## 2014-04-18 DIAGNOSIS — Z950 Presence of cardiac pacemaker: Secondary | ICD-10-CM | POA: Diagnosis not present

## 2014-04-18 DIAGNOSIS — T82898A Other specified complication of vascular prosthetic devices, implants and grafts, initial encounter: Secondary | ICD-10-CM

## 2014-04-18 HISTORY — PX: SHUNTOGRAM: SHX5491

## 2014-04-18 LAB — POCT I-STAT, CHEM 8
BUN: 34 mg/dL — ABNORMAL HIGH (ref 6–23)
CALCIUM ION: 1.2 mmol/L (ref 1.12–1.23)
CHLORIDE: 97 mmol/L (ref 96–112)
Creatinine, Ser: 6.2 mg/dL — ABNORMAL HIGH (ref 0.50–1.10)
Glucose, Bld: 84 mg/dL (ref 70–99)
HCT: 37 % (ref 36.0–46.0)
Hemoglobin: 12.6 g/dL (ref 12.0–15.0)
POTASSIUM: 3.8 mmol/L (ref 3.5–5.1)
Sodium: 137 mmol/L (ref 135–145)
TCO2: 24 mmol/L (ref 0–100)

## 2014-04-18 LAB — GLUCOSE, CAPILLARY: Glucose-Capillary: 80 mg/dL (ref 70–99)

## 2014-04-18 SURGERY — ASSESSMENT, SHUNT FUNCTION, WITH CONTRAST RADIOGRAPHIC STUDY
Anesthesia: LOCAL | Laterality: Left

## 2014-04-18 MED ORDER — SODIUM CHLORIDE 0.9 % IV SOLN: 250.0000 mL | INTRAVENOUS | Status: DC | PRN

## 2014-04-18 MED ORDER — SODIUM CHLORIDE 0.9 % IJ SOLN: 3.0000 mL | INTRAMUSCULAR | Status: DC | PRN

## 2014-04-18 MED ORDER — SODIUM CHLORIDE 0.9 % IJ SOLN: 3.0000 mL | Freq: Two times a day (BID) | INTRAMUSCULAR | Status: DC

## 2014-04-18 MED ORDER — LIDOCAINE HCL (PF) 1 % IJ SOLN
INTRAMUSCULAR | Status: AC
  Filled 2014-04-18: qty 30

## 2014-04-18 MED ORDER — FENTANYL CITRATE 0.05 MG/ML IJ SOLN
INTRAMUSCULAR | Status: AC
  Filled 2014-04-18: qty 2

## 2014-04-18 MED ORDER — HEPARIN (PORCINE) IN NACL 2-0.9 UNIT/ML-% IJ SOLN
INTRAMUSCULAR | Status: AC
  Filled 2014-04-18: qty 500

## 2014-04-18 MED ORDER — HEPARIN SODIUM (PORCINE) 1000 UNIT/ML IJ SOLN
INTRAMUSCULAR | Status: AC
  Filled 2014-04-18: qty 1

## 2014-04-18 MED ORDER — OXYCODONE-ACETAMINOPHEN 5-325 MG PO TABS: 1.0000 | ORAL_TABLET | Freq: Four times a day (QID) | ORAL | Status: DC | PRN

## 2014-04-18 NOTE — Discharge Instructions (Signed)
Fistulogram, Care After °Refer to this sheet in the next few weeks. These instructions provide you with information on caring for yourself after your procedure. Your health care provider may also give you more specific instructions. Your treatment has been planned according to current medical practices, but problems sometimes occur. Call your health care provider if you have any problems or questions after your procedure. °WHAT TO EXPECT AFTER THE PROCEDURE °After your procedure, it is typical to have the following: °· A small amount of discomfort in the area where the catheters were placed. °· A small amount of bruising around the fistula. °· Sleepiness and fatigue. °HOME CARE INSTRUCTIONS °· Rest at home for the day following your procedure. °· Do not drive or operate heavy machinery while taking pain medicine. °· Take medicines only as directed by your health care provider. °· Do not take baths, swim, or use a hot tub until your health care provider approves. You may shower 24 hours after the procedure or as directed by your health care provider. °· There are many different ways to close and cover an incision, including stitches, skin glue, and adhesive strips. Follow your health care provider's instructions on: °¨ Incision care. °¨ Bandage (dressing) changes and removal. °¨ Incision closure removal. °· Monitor your dialysis fistula carefully. °SEEK MEDICAL CARE IF: °· You have drainage, redness, swelling, or pain at your catheter site. °· You have a fever. °· You have chills. °SEEK IMMEDIATE MEDICAL CARE IF: °· You feel weak. °· You have trouble balancing. °· You have trouble moving your arms or legs. °· You have problems with your speech or vision. °· You can no longer feel a vibration or buzz when you put your fingers over your dialysis fistula. °· The limb that was used for the procedure: °¨ Swells. °¨ Is painful. °¨ Is cold. °¨ Is discolored, such as blue or pale white. °Document Released: 07/05/2013  Document Reviewed: 04/09/2013 °Exit[employer]® Patient Information ©2015 [employer]. This information is not intended to replace advice given to you by your health care provider. Make sure you discuss any questions you have with your health care provider. ° °

## 2014-04-18 NOTE — H&P (Signed)
Brief History and Physical  History of Present Illness  Amy Mathews is a 54 y.o. female who presents with chief complaint: L side swelling.  The patient presents today for L arm fistulogram, possible intervention.  The patient has had intermittent difficulty with cannulation.  She notes this is also dependent on the personnel available for cannulation.    She is having prolonged bleeding after cannula removal.  Past Medical History  Diagnosis Date  . Anemia   . Chronic kidney disease   . CAD (coronary artery disease)   . Thyroid disease     hypothyroidism  . Hyperlipidemia   . Hypertension   . Pacemaker   . Pneumonia   . Hypothyroidism   . Seizures     in 2006  . Headache(784.0)     migraines  . Arthritis   . History of blood transfusion     Past Surgical History  Procedure Laterality Date  . Coronary artery bypass graft    . Pacemaker insertion    . Endometrial biopsy    . Renal biopsy    . Dilation and curettage of uterus    . Bascilic vein transposition Left 06/16/2013    Procedure: LEFT 1ST STAGE BRACHIAL VEIN TRANSPOSITION ;  Surgeon: Fransisco HertzBrian L Rashaun Curl, MD;  [facility];  Service: Vascular;  Laterality: Left;  . Colonoscopy    . Bascilic vein transposition Left 08/09/2013    Procedure: SECOND STAGE BASILIC VEIN TRANSPOSITION;  Surgeon: Fransisco HertzBrian L Saqib Cazarez, MD;  [facility];  Service: Vascular;  Laterality: Left;  Amy Mathews. Venogram N/A 05/31/2013    Procedure: VENOGRAM;  Surgeon: Chuck Hinthristopher S Dickson, MD;  [facility];  Service: Cardiovascular;  Laterality: N/A;    History   Social History  . Marital Status: Single    Spouse Name: N/A  . Number of Children: N/A  . Years of Education: N/A   Occupational History  . Not on file.   Social History Main Topics  . Smoking status: Former Smoker    Types: Cigarettes    Quit date: 03/05/2003  . Smokeless tobacco: Never Used  . Alcohol Use: No  . Drug Use: No  . Sexual Activity: Not on file   Other Topics  Concern  . Not on file   Social History Narrative    Family History  Problem Relation Age of Onset  . Hypertension Mother     No current facility-administered medications on file prior to encounter.   Current Outpatient Prescriptions on File Prior to Encounter  Medication Sig Dispense Refill  . furosemide (LASIX) 40 MG tablet Take 120 mg by mouth 2 (two) times daily.     . hydrALAZINE (APRESOLINE) 25 MG tablet Take 50 mg by mouth 2 (two) times daily.     Marland Kitchen. levothyroxine (SYNTHROID, LEVOTHROID) 75 MCG tablet Take 75 mcg by mouth daily before breakfast.    . omeprazole (PRILOSEC) 40 MG capsule Take 40 mg by mouth at bedtime.     . sevelamer carbonate (RENVELA) 800 MG tablet Take 800 mg by mouth 3 (three) times daily with meals.    . simvastatin (ZOCOR) 20 MG tablet Take 20 mg by mouth daily.    Marland Kitchen. oxyCODONE (ROXICODONE) 5 MG immediate release tablet Take 1 tablet (5 mg total) by mouth every 4 (four) hours as needed for severe pain. 30 tablet 0    Allergies  Allergen Reactions  . Penicillins Hives    Review of Systems: As listed above, otherwise  negative.  Physical Examination  Filed Vitals:   04/18/14 1146  BP: 143/83  Pulse: 72  Temp: 97.8 F (36.6 C)  TempSrc: Oral  Resp: 18  Height:  (1.651 m)  Weight: 210 lb (95.255 kg)  SpO2: 96%    General: A&O x 3, WDWN, ill appearing  Pulmonary: Sym exp, good air movt, CTAB, no rales, rhonchi, & wheezing, some L chest venous distension  Cardiac: RRR, Nl S1, S2, no Murmurs, rubs or gallops  Gastrointestinal: soft, NTND, -G/R, - HSM, - masses, - CVAT B  Musculoskeletal: M/S 5/5 throughout , Extremities without ischemic changes , palpable thrill in LUA AVF, palpable PSA  Laboratory See iStat  Medical Decision Making  Amy Mathews is a 54 y.o. female who presents with: possible venous stenosis in L BVT   The patient is scheduled for: L arm fistulogram, possible intervention  I discussed with the patient the  nature of angiographic procedures, especially the limited patencies of any endovascular intervention.  The patient is aware of that the risks of an angiographic procedure include but are not limited to: bleeding, infection, access site complications, renal failure, embolization, rupture of vessel, dissection, possible need for emergent surgical intervention, possible need for surgical procedures to treat the patient's pathology, and stroke and death.    The patient is aware of the risks and agrees to proceed.  Leonides Sake, MD [facility] [place] Office: [phone] Pager: [phone]  04/18/2014, 12:24 PM

## 2014-04-18 NOTE — Op Note (Signed)
     OPERATIVE NOTE   PROCEDURE: 1.  left basilic vein transposition cannulation under ultrasound guidance 2.  left arm fistulogram 3.  Venoplasty of basilic vein x 2 (6 mm x 80 mm, 8 mm x 80 mm)  PRE-OPERATIVE DIAGNOSIS: Malfunctioning left basilic vein transposition   POST-OPERATIVE DIAGNOSIS: same as above   SURGEON: Adele Barthel, MD  ANESTHESIA: local  ESTIMATED BLOOD LOSS: 5 cc  FINDING(S): 1. Proximal stenosis (~6 cm): ~3.5 mm dilated to 5.0 mm after serial dilation 2. Degenerative fistula distally 3. Widely patent anastomosis 4. O-configuration in proximal basilic vein  SPECIMEN(S):  None  CONTRAST: 25 cc  INDICATIONS: Amy Mathews is a 54 y.o. female who presents with malfunctioning left basilic vein transposition.  The patient is scheduled for left arm fistulogram, possible intervention.  The patient is aware the risks include but are not limited to: bleeding, infection, thrombosis of the cannulated access, and possible anaphylactic reaction to the contrast.  The patient is aware of the risks of the procedure and elects to proceed forward.  DESCRIPTION: After full informed written consent was obtained, the patient was brought back to the angiography suite and placed supine upon the angiography table.  The patient was connected to monitoring equipment.  The left upper arm was prepped and draped in the standard fashion for a percutaneous access intervention.  Under ultrasound guidance, the left basilic vein transposition was cannulated with a micropuncture needle.  The microwire was advanced into the fistula and the needle was exchanged for the a microsheath, which was lodged 2 cm into the access.  The wire was removed and the sheath was connected to the IV extension tubing.  Hand injections were completed to image the access from the antecubitum up to the level of axilla.  The central venous structures were also imaged by hand injections.  Based on the images, this  patient will need: venoplasty.  A Benson wire was advanced into the axillary vein and the sheath was exchanged for a short 6-Fr sheath.  Based on the imaging, a 6 mm x 80 mm angioplasty balloon was selected.  The balloon was centered around the stenosis and inflated to 12 ATM for 2 minutes.  On completion imaging, a >30% residual stenosis was present.  At this point, the balloon was exchanged for a 8 mm x 80 mm angioplasty balloon.  The balloon was centered around the stenosis and inflated to 5 ATM for 2 minutes.  Despite giving the patient narcotics for pain, she could not tolerate inflating the balloon past 5 ATM.  The completion imaging demonstrated ~5 mm lumen in the previous stenotic segment.  The thrill in the fistula was improved so I felt it was reasonable to stop at this point.  Based on the completion imaging, no further intervention is necessary.  The wire and balloon were removed from the sheath.  A 4-0 Monocryl purse-string suture was sewn around the sheath.  The sheath was removed while tying down the suture.  A sterile bandage was applied to the puncture site.  In the future, I would recommend any further percutaneous intervention to be completed in the hybrid operating room to allow more aggressive sedation and pain control.  COMPLICATIONS: none  CONDITION: stable   Adele Barthel, MD [facility] [place] Office: [phone] Pager: [phone]  04/18/2014 2:20 PM

## 2014-04-19 ENCOUNTER — Telehealth: Payer: Self-pay | Admitting: Vascular Surgery

## 2014-04-19 NOTE — Telephone Encounter (Signed)
-----   Message from Sharee PimpleMarilyn K McChesney, RN sent at 04/18/2014  3:04 PM EST ----- Regarding: Schedule   ----- Message -----    From: Fransisco HertzBrian L Chen, MD    Sent: 04/18/2014   2:25 PM      To: [facility] [address]  [place] F[place] J Mathews [id number] 13-Dec-1960  PROCEDURE: 1.  left basilic vein transposition cannulation under ultrasound guidance 2.  left arm fistulogram 3.  Venoplasty of basilic vein x 2 (6 mm x 80 mm, 8 mm x 80 mm)  F/U: 3 mon with access duplex

## 2014-04-19 NOTE — Telephone Encounter (Signed)
Spoke with pt to schedule, dpm °

## 2014-06-22 ENCOUNTER — Other Ambulatory Visit: Payer: Self-pay | Admitting: Unknown Physician Specialty

## 2014-07-06 ENCOUNTER — Other Ambulatory Visit: Payer: Self-pay | Admitting: Unknown Physician Specialty

## 2014-07-06 DIAGNOSIS — N63 Unspecified lump in unspecified breast: Secondary | ICD-10-CM

## 2014-07-21 ENCOUNTER — Encounter: Payer: Self-pay | Admitting: Vascular Surgery

## 2014-07-22 ENCOUNTER — Ambulatory Visit (INDEPENDENT_AMBULATORY_CARE_PROVIDER_SITE_OTHER): Payer: Medicare Other | Admitting: Vascular Surgery

## 2014-07-22 ENCOUNTER — Encounter: Payer: Self-pay | Admitting: Vascular Surgery

## 2014-07-22 ENCOUNTER — Ambulatory Visit (HOSPITAL_COMMUNITY)
Admission: RE | Admit: 2014-07-22 | Discharge: 2014-07-22 | Disposition: A | Discharge status: Home / Self Care | Payer: Medicare Other | Source: Ambulatory Visit | Attending: Vascular Surgery | Admitting: Vascular Surgery

## 2014-07-22 ENCOUNTER — Other Ambulatory Visit: Payer: Self-pay

## 2014-07-22 VITALS — BP 124/67 | HR 74 | Ht 65.0 in | Wt 188.0 lb

## 2014-07-22 DIAGNOSIS — Z4931 Encounter for adequacy testing for hemodialysis: Secondary | ICD-10-CM

## 2014-07-22 DIAGNOSIS — N186 End stage renal disease: Secondary | ICD-10-CM

## 2014-07-22 NOTE — Progress Notes (Signed)
Established Dialysis Access  History of Present Illness  Amy Mathews is a 54 y.o. ([birth year]) female who presents for re-evaluation of L BVT.  This patient underwent most recently a venoplasty of L BVT on 04/18/14.  The patient denies any steal sx normally but notes during HD get a sense of coldness in her L hand with pain.  She denies any flow rate issues on hemodialysis.    Past Medical History  Diagnosis Date  . Anemia   . Chronic kidney disease   . CAD (coronary artery disease)   . Thyroid disease     hypothyroidism  . Hyperlipidemia   . Hypertension   . Pacemaker   . Pneumonia   . Hypothyroidism   . Seizures     in 2006  . Headache(784.0)     migraines  . Arthritis   . History of blood transfusion     Past Surgical History  Procedure Laterality Date  . Coronary artery bypass graft    . Pacemaker insertion    . Endometrial biopsy    . Renal biopsy    . Dilation and curettage of uterus    . Bascilic vein transposition Left 06/16/2013    Procedure: LEFT 1ST STAGE BRACHIAL VEIN TRANSPOSITION ;  Surgeon: Fransisco HertzBrian L Carman Auxier, MD;  [facility];  Service: Vascular;  Laterality: Left;  . Colonoscopy    . Bascilic vein transposition Left 08/09/2013    Procedure: SECOND STAGE BASILIC VEIN TRANSPOSITION;  Surgeon: Fransisco HertzBrian L Calel Pisarski, MD;  [facility];  Service: Vascular;  Laterality: Left;  Susie Cassette. Venogram N/A 05/31/2013    Procedure: VENOGRAM;  Surgeon: Chuck Hinthristopher S Dickson, MD;  [facility];  Service: Cardiovascular;  Laterality: N/A;  . Shuntogram Left 04/18/2014    Procedure: FISTULOGRAM;  Surgeon: Fransisco HertzBrian L Dianca Owensby, MD;  [facility];  Service: Cardiovascular;  Laterality: Left;    History   Social History  . Marital Status: Single    Spouse Name: N/A  . Number of Children: N/A  . Years of Education: N/A   Occupational History  . Not on file.   Social History Main Topics  . Smoking status: Former Smoker    Types: Cigarettes    Quit date: 03/05/2003    . Smokeless tobacco: Never Used  . Alcohol Use: No  . Drug Use: No  . Sexual Activity: Not on file   Other Topics Concern  . Not on file   Social History Narrative    Family History  Problem Relation Age of Onset  . Hypertension Mother     Current Outpatient Prescriptions on File Prior to Visit  Medication Sig Dispense Refill  . aspirin 81 MG tablet Take 81 mg by mouth daily.    . furosemide (LASIX) 40 MG tablet Take 120 mg by mouth 2 (two) times daily.     Marland Kitchen. labetalol (NORMODYNE) 100 MG tablet Take 100 mg by mouth 2 (two) times daily.    Marland Kitchen. levothyroxine (SYNTHROID, LEVOTHROID) 75 MCG tablet Take 75 mcg by mouth daily before breakfast.    . omeprazole (PRILOSEC) 40 MG capsule Take 40 mg by mouth at bedtime.     Marland Kitchen. oxyCODONE (ROXICODONE) 5 MG immediate release tablet Take 1 tablet (5 mg total) by mouth every 4 (four) hours as needed for severe pain. (Patient not taking: Reported on 07/22/2014) 30 tablet 0  . sevelamer carbonate (RENVELA) 800 MG tablet Take 800 mg by mouth 3 (three) times daily with meals.    .Marland Kitchen  simvastatin (ZOCOR) 20 MG tablet Take 20 mg by mouth daily.     No current facility-administered medications on file prior to visit.    Allergies  Allergen Reactions  . Penicillins Hives    REVIEW OF SYSTEMS:  (Positives checked otherwise negative)  CARDIOVASCULAR:  []  chest pain, []  chest pressure, []  palpitations, []  shortness of breath when laying flat, []  shortness of breath with exertion,  []  pain in feet when walking, []  pain in feet when laying flat, []  history of blood clot in veins (DVT), []  history of phlebitis, []  swelling in legs, []  varicose veins  PULMONARY:  []  productive cough, []  asthma, []  wheezing  NEUROLOGIC:  []  weakness in arms or legs, []  numbness in arms or legs, []  difficulty speaking or slurred speech, []  temporary loss of vision in one eye, []  dizziness  HEMATOLOGIC:  []  bleeding problems, []  problems with blood clotting too  easily  MUSCULOSKEL:  []  joint pain, []  joint swelling  GASTROINTEST:  []  vomiting blood, []  blood in stool     GENITOURINARY:  []  burning with urination, []  blood in urine  PSYCHIATRIC:  []  history of major depression  INTEGUMENTARY:  []  rashes, []  ulcers   Physical Examination  Filed Vitals:   07/22/14 1119  BP: 124/67  Pulse: 74  Height: 5\' 5"  (1.651 m)  Weight: 188 lb (85.276 kg)  SpO2: 98%   Body mass index is 31.28 kg/(m^2).  General: A&O x 3, WD, WN  Pulmonary: Sym exp, good air movt, CTAB, no rales, rhonchi, & wheezing  Cardiac: RRR, Nl S1, S2, no Murmurs, rubs or gallops  Vascular: Vessel Right Left  Radial Palpable Faintly Palpable  Ulnar Not Palpable Not Palpable  Brachial Palpable Palpable   Gastrointestinal: soft, NTND, -G/R, - HSM, - masses, - CVAT B  Musculoskeletal: M/S 5/5 throughout , Extremities without  ischemic changes , palpable thrill in access in L BVT, + bruit in access  Neurologic: Pain and light touch intact in extremities , Motor exam as listed above  Non-Invasive Vascular Imaging  left arm Access Duplex  (Date: 07/22/2014):   Diameters:  0.9-1.2 cm  Partial occlusive thrombus in mid-segment  Medical Decision Making  Amy Mathews is a 54 y.o. 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  Will schedule patient for L arm fistulogram and balloon maceration of thrombus to salvage the L BVT.  I discussed with the patient the nature of angiographic procedures, especially the limited patencies of any endovascular intervention.  The patient is aware of that the risks of an angiographic procedure include but are not limited to: bleeding, infection, access site complications, renal failure, embolization, rupture of vessel, dissection, possible need for emergent surgical intervention, possible need for surgical procedures to treat  the patient's pathology, anaphylactic reaction to contrast, and stroke and death.    The patient is aware of the risks and agrees to proceed.  Leonides SakeBrian Cassandra Harbold, MD [facility] [place] Office: [phone] Pager: [phone]  07/22/2014, 1:42 PM

## 2014-07-25 ENCOUNTER — Ambulatory Visit (HOSPITAL_COMMUNITY)
Admission: RE | Admit: 2014-07-25 | Discharge: 2014-07-25 | Disposition: A | Discharge status: Home / Self Care | Payer: Medicare Other | Source: Ambulatory Visit | Attending: Vascular Surgery | Admitting: Vascular Surgery

## 2014-07-25 ENCOUNTER — Encounter (HOSPITAL_COMMUNITY)
Admission: RE | Disposition: A | Discharge status: Home / Self Care | Payer: Medicare Other | Source: Ambulatory Visit | Attending: Vascular Surgery

## 2014-07-25 DIAGNOSIS — Z992 Dependence on renal dialysis: Secondary | ICD-10-CM | POA: Diagnosis not present

## 2014-07-25 DIAGNOSIS — I120 Hypertensive chronic kidney disease with stage 5 chronic kidney disease or end stage renal disease: Secondary | ICD-10-CM | POA: Diagnosis present

## 2014-07-25 DIAGNOSIS — N186 End stage renal disease: Secondary | ICD-10-CM | POA: Insufficient documentation

## 2014-07-25 DIAGNOSIS — Z7982 Long term (current) use of aspirin: Secondary | ICD-10-CM | POA: Diagnosis not present

## 2014-07-25 DIAGNOSIS — E039 Hypothyroidism, unspecified: Secondary | ICD-10-CM | POA: Insufficient documentation

## 2014-07-25 DIAGNOSIS — Z87891 Personal history of nicotine dependence: Secondary | ICD-10-CM | POA: Insufficient documentation

## 2014-07-25 DIAGNOSIS — I2510 Atherosclerotic heart disease of native coronary artery without angina pectoris: Secondary | ICD-10-CM | POA: Diagnosis not present

## 2014-07-25 DIAGNOSIS — E785 Hyperlipidemia, unspecified: Secondary | ICD-10-CM | POA: Insufficient documentation

## 2014-07-25 DIAGNOSIS — T82898A Other specified complication of vascular prosthetic devices, implants and grafts, initial encounter: Secondary | ICD-10-CM

## 2014-07-25 HISTORY — PX: PERIPHERAL VASCULAR CATHETERIZATION: SHX172C

## 2014-07-25 HISTORY — PX: ULTRASOUND GUIDANCE FOR VASCULAR ACCESS: SHX6516

## 2014-07-25 LAB — POCT I-STAT, CHEM 8
BUN: 40 mg/dL — ABNORMAL HIGH (ref 6–20)
Calcium, Ion: 1.14 mmol/L (ref 1.12–1.23)
Chloride: 99 mmol/L — ABNORMAL LOW (ref 101–111)
Creatinine, Ser: 5.9 mg/dL — ABNORMAL HIGH (ref 0.44–1.00)
Glucose, Bld: 84 mg/dL (ref 65–99)
HCT: 35 % — ABNORMAL LOW (ref 36.0–46.0)
Hemoglobin: 11.9 g/dL — ABNORMAL LOW (ref 12.0–15.0)
Potassium: 4.8 mmol/L (ref 3.5–5.1)
Sodium: 138 mmol/L (ref 135–145)
TCO2: 25 mmol/L (ref 0–100)

## 2014-07-25 SURGERY — A/V SHUNTOGRAM/FISTULAGRAM
Anesthesia: LOCAL

## 2014-07-25 MED ORDER — MIDAZOLAM HCL 2 MG/2ML IJ SOLN
INTRAMUSCULAR | Status: AC
  Filled 2014-07-25: qty 2

## 2014-07-25 MED ORDER — LIDOCAINE HCL (PF) 1 % IJ SOLN
INTRAMUSCULAR | Status: AC
  Filled 2014-07-25: qty 30

## 2014-07-25 MED ORDER — FENTANYL CITRATE (PF) 100 MCG/2ML IJ SOLN
INTRAMUSCULAR | Status: AC
  Filled 2014-07-25: qty 2

## 2014-07-25 MED ORDER — SODIUM CHLORIDE 0.9 % IJ SOLN: 3.0000 mL | INTRAMUSCULAR | Status: DC | PRN

## 2014-07-25 MED ORDER — OXYCODONE-ACETAMINOPHEN 5-325 MG PO TABS: 1.0000 | ORAL_TABLET | ORAL | Status: DC | PRN

## 2014-07-25 MED ORDER — MIDAZOLAM HCL 2 MG/2ML IJ SOLN
INTRAMUSCULAR | Status: DC | PRN
  Administered 2014-07-25 (×2): 1 mg via INTRAVENOUS

## 2014-07-25 MED ORDER — SODIUM CHLORIDE 0.9 % IJ SOLN: 3.0000 mL | Freq: Two times a day (BID) | INTRAMUSCULAR | Status: DC

## 2014-07-25 MED ORDER — HEPARIN (PORCINE) IN NACL 2-0.9 UNIT/ML-% IJ SOLN
INTRAMUSCULAR | Status: AC
  Filled 2014-07-25: qty 1000

## 2014-07-25 MED ORDER — SODIUM CHLORIDE 0.9 % IV SOLN: 250.0000 mL | INTRAVENOUS | Status: DC | PRN

## 2014-07-25 MED ORDER — FENTANYL CITRATE (PF) 100 MCG/2ML IJ SOLN
INTRAMUSCULAR | Status: DC | PRN
  Administered 2014-07-25 (×2): 25 ug via INTRAVENOUS

## 2014-07-25 SURGICAL SUPPLY — 18 items
BAG SNAP BAND KOVER 36X36 (MISCELLANEOUS) ×1 IMPLANT
BALLN MUSTANG 8.0X40 75 (BALLOONS) ×3
BALLOON MUSTANG 8.0X40 75 (BALLOONS) IMPLANT
COVER DOME SNAP 22 D (MISCELLANEOUS) ×1 IMPLANT
HAND CONTROLLER AVANTA (MISCELLANEOUS) IMPLANT
KIT ENCORE 26 ADVANTAGE (KITS) ×1 IMPLANT
KIT MICROINTRODUCER STIFF 5F (SHEATH) ×1 IMPLANT
KIT PV (KITS) ×2 IMPLANT
SET AVANTA MULTI PATIENT (MISCELLANEOUS) IMPLANT
SET AVANTA SINGLE PATIENT (MISCELLANEOUS) IMPLANT
SHEATH AVANTA HAND CONTROLLER (MISCELLANEOUS) IMPLANT
SHEATH PINNACLE R/O II 6F 4CM (SHEATH) ×1 IMPLANT
STOPCOCK MORSE 400PSI 3WAY (MISCELLANEOUS) ×1 IMPLANT
SYR MEDRAD MARK V 150ML (SYRINGE) IMPLANT
TRANSDUCER W/STOPCOCK (MISCELLANEOUS) ×2 IMPLANT
TRAY PV CATH (CUSTOM PROCEDURE TRAY) ×3 IMPLANT
TUBING CIL FLEX 10 FLL-RA (TUBING) ×1 IMPLANT
WIRE BENTSON .035X145CM (WIRE) ×1 IMPLANT

## 2014-07-25 NOTE — H&P (View-Only) (Signed)
Established Dialysis Access  History of Present Illness  Amy Mathews is a 54 y.o. (1960-08-15) female who presents for re-evaluation of L BVT.  This patient underwent most recently a venoplasty of L BVT on 04/18/14.  The patient denies any steal sx normally but notes during HD get a sense of coldness in her L hand with pain.  She denies any flow rate issues on hemodialysis.    Past Medical History  Diagnosis Date  . Anemia   . Chronic kidney disease   . CAD (coronary artery disease)   . Thyroid disease     hypothyroidism  . Hyperlipidemia   . Hypertension   . Pacemaker   . Pneumonia   . Hypothyroidism   . Seizures     in 2006  . Headache(784.0)     migraines  . Arthritis   . History of blood transfusion     Past Surgical History  Procedure Laterality Date  . Coronary artery bypass graft    . Pacemaker insertion    . Endometrial biopsy    . Renal biopsy    . Dilation and curettage of uterus    . Bascilic vein transposition Left 06/16/2013    Procedure: LEFT 1ST STAGE BRACHIAL VEIN TRANSPOSITION ;  Surgeon: Fransisco HertzBrian L Chen, MD;  [facility];  Service: Vascular;  Laterality: Left;  . Colonoscopy    . Bascilic vein transposition Left 08/09/2013    Procedure: SECOND STAGE BASILIC VEIN TRANSPOSITION;  Surgeon: Fransisco HertzBrian L Chen, MD;  [facility];  Service: Vascular;  Laterality: Left;  Susie Cassette. Venogram N/A 05/31/2013    Procedure: VENOGRAM;  Surgeon: Chuck Hinthristopher S Dickson, MD;  [facility];  Service: Cardiovascular;  Laterality: N/A;  . Shuntogram Left 04/18/2014    Procedure: FISTULOGRAM;  Surgeon: Fransisco HertzBrian L Chen, MD;  [facility];  Service: Cardiovascular;  Laterality: Left;    History   Social History  . Marital Status: Single    Spouse Name: N/A  . Number of Children: N/A  . Years of Education: N/A   Occupational History  . Not on file.   Social History Main Topics  . Smoking status: Former Smoker    Types: Cigarettes    Quit date: 03/05/2003    . Smokeless tobacco: Never Used  . Alcohol Use: No  . Drug Use: No  . Sexual Activity: Not on file   Other Topics Concern  . Not on file   Social History Narrative    Family History  Problem Relation Age of Onset  . Hypertension Mother     Current Outpatient Prescriptions on File Prior to Visit  Medication Sig Dispense Refill  . aspirin 81 MG tablet Take 81 mg by mouth daily.    . furosemide (LASIX) 40 MG tablet Take 120 mg by mouth 2 (two) times daily.     Marland Kitchen. labetalol (NORMODYNE) 100 MG tablet Take 100 mg by mouth 2 (two) times daily.    Marland Kitchen. levothyroxine (SYNTHROID, LEVOTHROID) 75 MCG tablet Take 75 mcg by mouth daily before breakfast.    . omeprazole (PRILOSEC) 40 MG capsule Take 40 mg by mouth at bedtime.     Marland Kitchen. oxyCODONE (ROXICODONE) 5 MG immediate release tablet Take 1 tablet (5 mg total) by mouth every 4 (four) hours as needed for severe pain. (Patient not taking: Reported on 07/22/2014) 30 tablet 0  . sevelamer carbonate (RENVELA) 800 MG tablet Take 800 mg by mouth 3 (three) times daily with meals.    .Marland Kitchen  simvastatin (ZOCOR) 20 MG tablet Take 20 mg by mouth daily.     No current facility-administered medications on file prior to visit.    Allergies  Allergen Reactions  . Penicillins Hives    REVIEW OF SYSTEMS:  (Positives checked otherwise negative)  CARDIOVASCULAR:  [] chest pain, [] chest pressure, [] palpitations, [] shortness of breath when laying flat, [] shortness of breath with exertion,  [] pain in feet when walking, [] pain in feet when laying flat, [] history of blood clot in veins (DVT), [] history of phlebitis, [] swelling in legs, [] varicose veins  PULMONARY:  [] productive cough, [] asthma, [] wheezing  NEUROLOGIC:  [] weakness in arms or legs, [] numbness in arms or legs, [] difficulty speaking or slurred speech, [] temporary loss of vision in one eye, [] dizziness  HEMATOLOGIC:  [] bleeding problems, [] problems with blood clotting too  easily  MUSCULOSKEL:  [] joint pain, [] joint swelling  GASTROINTEST:  [] vomiting blood, [] blood in stool     GENITOURINARY:  [] burning with urination, [] blood in urine  PSYCHIATRIC:  [] history of major depression  INTEGUMENTARY:  [] rashes, [] ulcers   Physical Examination  Filed Vitals:   07/22/14 1119  BP: 124/67  Pulse: 74  Height: 5' 5" (1.651 m)  Weight: 188 lb (85.276 kg)  SpO2: 98%   Body mass index is 31.28 kg/(m^2).  General: A&O x 3, WD, WN  Pulmonary: Sym exp, good air movt, CTAB, no rales, rhonchi, & wheezing  Cardiac: RRR, Nl S1, S2, no Murmurs, rubs or gallops  Vascular: Vessel Right Left  Radial Palpable Faintly Palpable  Ulnar Not Palpable Not Palpable  Brachial Palpable Palpable   Gastrointestinal: soft, NTND, -G/R, - HSM, - masses, - CVAT B  Musculoskeletal: M/S 5/5 throughout , Extremities without  ischemic changes , palpable thrill in access in L BVT, + bruit in access  Neurologic: Pain and light touch intact in extremities , Motor exam as listed above  Non-Invasive Vascular Imaging  left arm Access Duplex  (Date: 07/22/2014):   Diameters:  0.9-1.2 cm  Partial occlusive thrombus in mid-segment  Medical Decision Making  Amy Mathews is a 53 y.o. 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  Will schedule patient for L arm fistulogram and balloon maceration of thrombus to salvage the L BVT.  I discussed with the patient the nature of angiographic procedures, especially the limited patencies of any endovascular intervention.  The patient is aware of that the risks of an angiographic procedure include but are not limited to: bleeding, infection, access site complications, renal failure, embolization, rupture of vessel, dissection, possible need for emergent surgical intervention, possible need for surgical procedures to treat  the patient's pathology, anaphylactic reaction to contrast, and stroke and death.    The patient is aware of the risks and agrees to proceed.  Brian Chen, MD [facility] [place] Office: [phone] Pager: [phone]  07/22/2014, 1:42 PM   

## 2014-07-25 NOTE — Interval H&P Note (Signed)
[facility] [place]  History and Physical Update  The patient was interviewed and re-examined.  The patient's previous History and Physical has been reviewed and is unchanged from my consult consult.  There is no change in the plan of care: L arm fistulogram, possible intervention.  I discussed with the patient the nature of angiographic procedures, especially the limited patencies of any endovascular intervention.  The patient is aware of that the risks of an angiographic procedure include but are not limited to: bleeding, infection, access site complications, renal failure, embolization, rupture of vessel, dissection, possible need for emergent surgical intervention, possible need for surgical procedures to treat the patient's pathology, anaphylactic reaction to contrast, and stroke and death.  The patient is aware of the risks and agrees to proceed.   Leonides SakeBrian Alfonso Shackett, MD [facility] [place] Office: [phone] Pager: [phone]  07/25/2014, 11:51 AM

## 2014-07-25 NOTE — Discharge Instructions (Signed)
Fistulogram, Care After °Refer to this sheet in the next few weeks. These instructions provide you with information on caring for yourself after your procedure. Your health care provider may also give you more specific instructions. Your treatment has been planned according to current medical practices, but problems sometimes occur. Call your health care provider if you have any problems or questions after your procedure. °WHAT TO EXPECT AFTER THE PROCEDURE °After your procedure, it is typical to have the following: °· A small amount of discomfort in the area where the catheters were placed. °· A small amount of bruising around the fistula. °· Sleepiness and fatigue. °HOME CARE INSTRUCTIONS °· Rest at home for the day following your procedure. °· Do not drive or operate heavy machinery while taking pain medicine. °· Take medicines only as directed by your health care provider. °· Do not take baths, swim, or use a hot tub until your health care provider approves. You may shower 24 hours after the procedure or as directed by your health care provider. °· There are many different ways to close and cover an incision, including stitches, skin glue, and adhesive strips. Follow your health care provider's instructions on: °¨ Incision care. °¨ Bandage (dressing) changes and removal. °¨ Incision closure removal. °· Monitor your dialysis fistula carefully. °SEEK MEDICAL CARE IF: °· You have drainage, redness, swelling, or pain at your catheter site. °· You have a fever. °· You have chills. °SEEK IMMEDIATE MEDICAL CARE IF: °· You feel weak. °· You have trouble balancing. °· You have trouble moving your arms or legs. °· You have problems with your speech or vision. °· You can no longer feel a vibration or buzz when you put your fingers over your dialysis fistula. °· The limb that was used for the procedure: °¨ Swells. °¨ Is painful. °¨ Is cold. °¨ Is discolored, such as blue or pale white. °Document Released: 07/05/2013  Document Reviewed: 04/09/2013 °Exit[employer]® Patient Information ©2015 [employer]. This information is not intended to replace advice given to you by your health care provider. Make sure you discuss any questions you have with your health care provider. ° °

## 2014-07-26 ENCOUNTER — Telehealth: Payer: Self-pay | Admitting: Vascular Surgery

## 2014-07-26 MED FILL — Heparin Sodium (Porcine) 2 Unit/ML in Sodium Chloride 0.9%: INTRAMUSCULAR | Qty: 1000 | Status: AC

## 2014-07-26 MED FILL — Lidocaine HCl Local Preservative Free (PF) Inj 1%: INTRAMUSCULAR | Qty: 30 | Status: AC

## 2014-07-26 NOTE — Telephone Encounter (Signed)
-----   Message from Sharee PimpleMarilyn K McChesney, RN sent at 07/25/2014  4:22 PM EDT ----- Regarding: Schedule FYI no follow up    ----- Message -----    From: Larina Earthlyodd F Early, MD    Sent: 07/25/2014   4:02 PM      To: Vvs Charge Pool  I did a shuntogram on this patient for Dr. Imogene Burnhen.  Also had the angioplasty of mid fistula. Does not need office follow-up.

## 2014-07-27 ENCOUNTER — Encounter (HOSPITAL_COMMUNITY): Payer: Self-pay | Admitting: Vascular Surgery

## 2014-10-31 ENCOUNTER — Encounter: Payer: Medicare Other | Admitting: Internal Medicine

## 2015-01-13 ENCOUNTER — Other Ambulatory Visit: Payer: Self-pay

## 2015-01-20 ENCOUNTER — Encounter (HOSPITAL_COMMUNITY): Admission: RE | Payer: Self-pay | Source: Ambulatory Visit

## 2015-01-20 ENCOUNTER — Ambulatory Visit (HOSPITAL_COMMUNITY): Admission: RE | Admit: 2015-01-20 | Payer: Medicare Other | Source: Ambulatory Visit | Admitting: Vascular Surgery

## 2015-01-20 SURGERY — A/V SHUNTOGRAM/FISTULAGRAM
Anesthesia: LOCAL

## 2015-04-20 ENCOUNTER — Other Ambulatory Visit (HOSPITAL_COMMUNITY): Payer: Self-pay | Admitting: Nephrology

## 2015-04-20 ENCOUNTER — Other Ambulatory Visit: Payer: Self-pay | Admitting: Radiology

## 2015-04-20 DIAGNOSIS — T82868A Thrombosis of vascular prosthetic devices, implants and grafts, initial encounter: Secondary | ICD-10-CM

## 2015-04-21 ENCOUNTER — Inpatient Hospital Stay (HOSPITAL_COMMUNITY): Admission: RE | Admit: 2015-04-21 | Payer: Medicare Other | Source: Ambulatory Visit

## 2015-04-27 ENCOUNTER — Other Ambulatory Visit: Payer: Self-pay

## 2015-04-27 DIAGNOSIS — N186 End stage renal disease: Secondary | ICD-10-CM

## 2015-04-27 DIAGNOSIS — Z01810 Encounter for preprocedural cardiovascular examination: Secondary | ICD-10-CM

## 2015-05-02 ENCOUNTER — Encounter: Payer: Self-pay | Admitting: Vascular Surgery

## 2015-05-10 ENCOUNTER — Ambulatory Visit (HOSPITAL_COMMUNITY)
Admission: RE | Admit: 2015-05-10 | Discharge: 2015-05-10 | Disposition: A | Discharge status: Home / Self Care | Payer: Medicare Other | Source: Ambulatory Visit | Attending: Vascular Surgery | Admitting: Vascular Surgery

## 2015-05-10 ENCOUNTER — Ambulatory Visit (HOSPITAL_COMMUNITY): Admission: RE | Admit: 2015-05-10 | Payer: Medicare Other | Source: Ambulatory Visit

## 2015-05-10 ENCOUNTER — Ambulatory Visit: Payer: Medicare Other | Admitting: Vascular Surgery

## 2015-06-30 ENCOUNTER — Encounter: Payer: Self-pay | Admitting: Vascular Surgery

## 2015-07-06 ENCOUNTER — Encounter (HOSPITAL_COMMUNITY): Payer: Medicare Other

## 2015-07-06 ENCOUNTER — Ambulatory Visit: Payer: Medicare Other | Admitting: Vascular Surgery

## 2015-07-06 ENCOUNTER — Other Ambulatory Visit (HOSPITAL_COMMUNITY): Payer: Medicare Other

## 2015-08-03 DEATH — deceased

## 2016-03-31 IMAGING — CR DG CHEST 2V
2 series · 2 of 2 positions shown · non-contrast
Comparison: May 07, 2013

CLINICAL DATA: Hypertension; preoperative evaluation

EXAM:
CHEST  2 VIEW

[w chest pa]
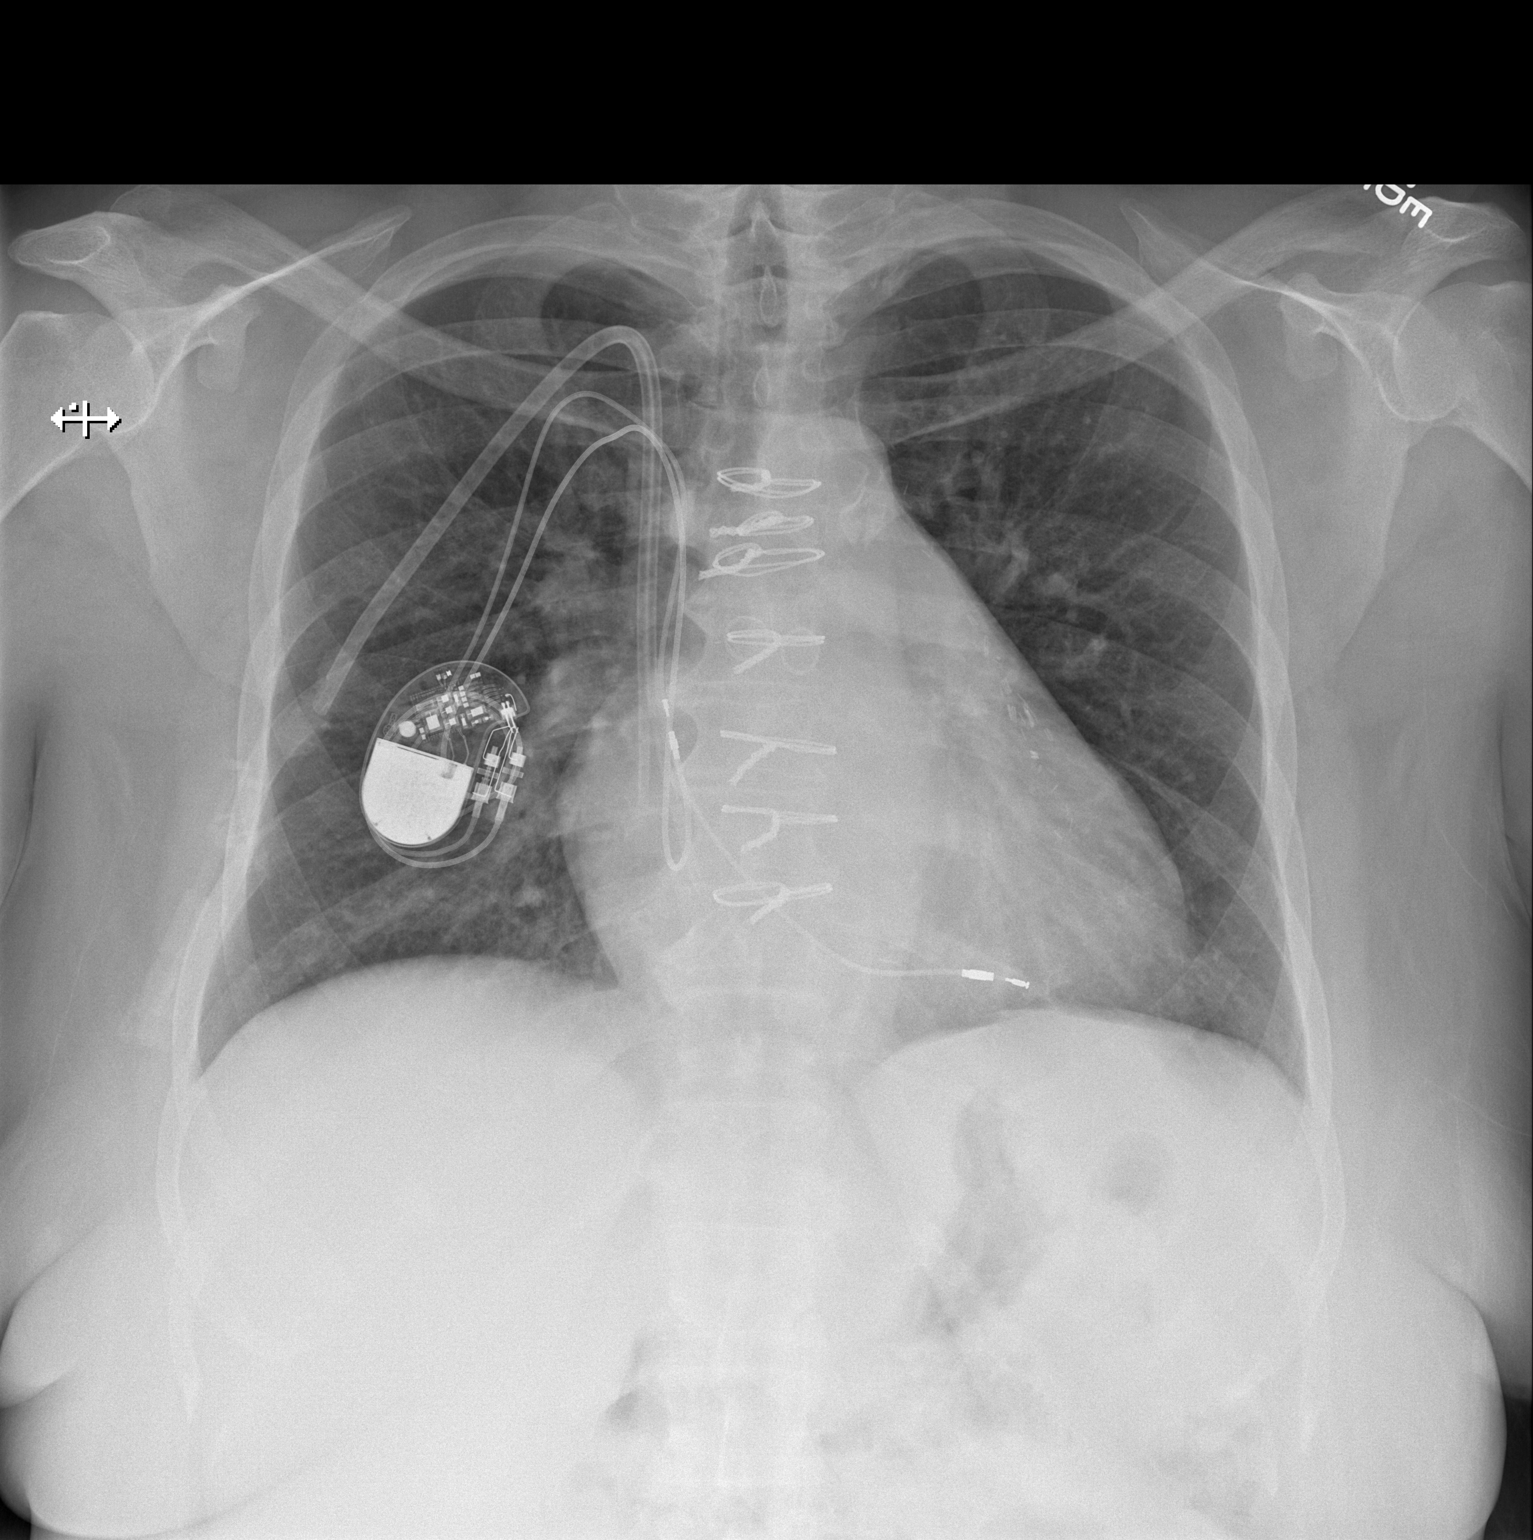

[w chest lat]
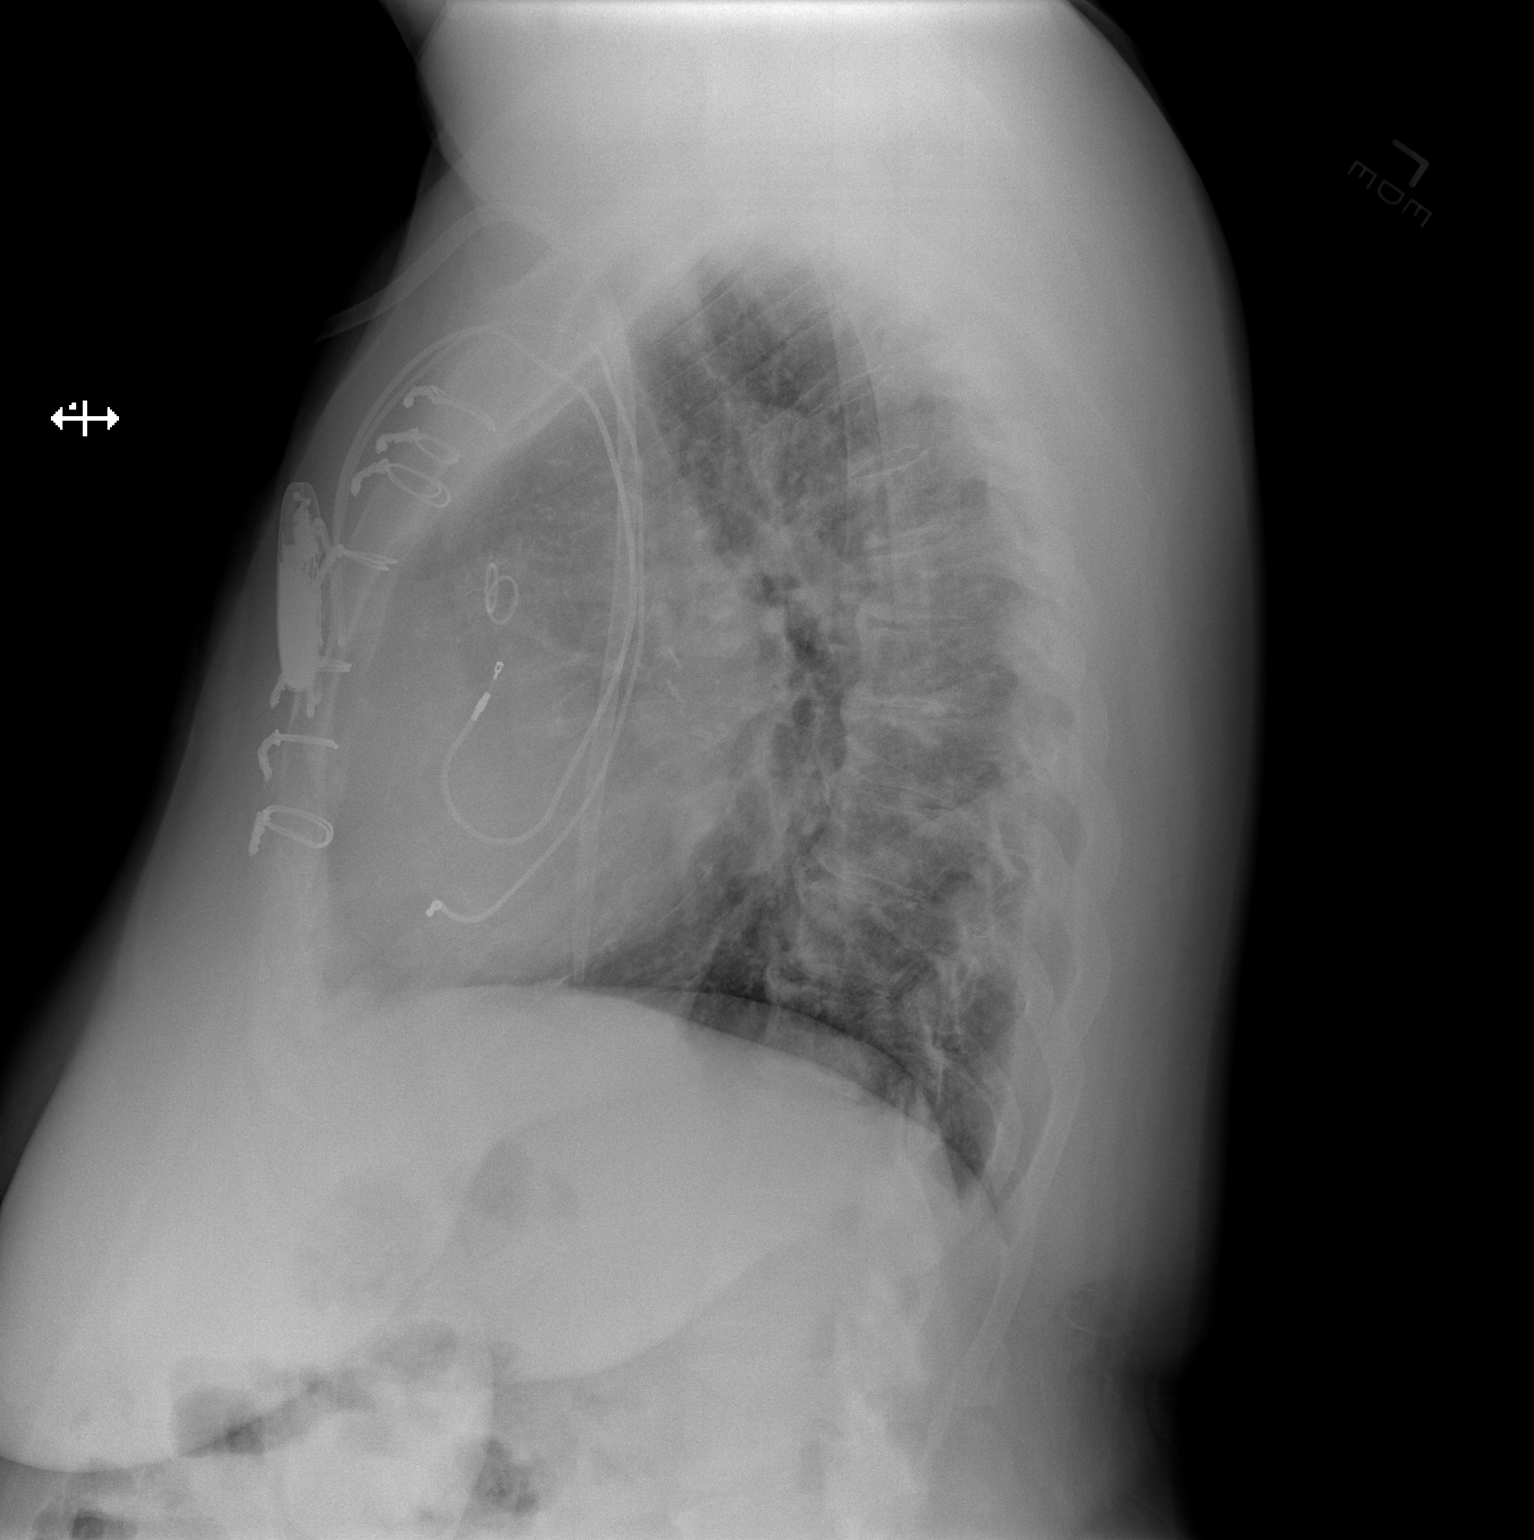

[2 of 2 positions shown; findings below may reference images not displayed]

FINDINGS: Lungs are clear. Heart is slightly enlarged with normal pulmonary
vascularity. Pacemaker leads are attached to the right atrium and
right ventricle. Central catheter tip is in the right atrium.
Patient is status post coronary artery bypass grafting. No
adenopathy. No pneumothorax. There is degenerative change in the
thoracic spine.
IMPRESSION: No edema or consolidation.  Heart mildly enlarged.

## 2016-10-06 IMAGING — US IR REMOVAL TUNNELED CV CATH
1 series · 1 of 1 positions shown · non-contrast
Comparison: none

CLINICAL DATA: Request for right Internal Jugular tunneled HD
catheter removal that is no longer needed. Left arm fistula working
well and (+) bruit.

[Series 1: sp removal tun cv cath w/o fl · non-contrast · 1 of 1 slices shown]
[im 1/1]
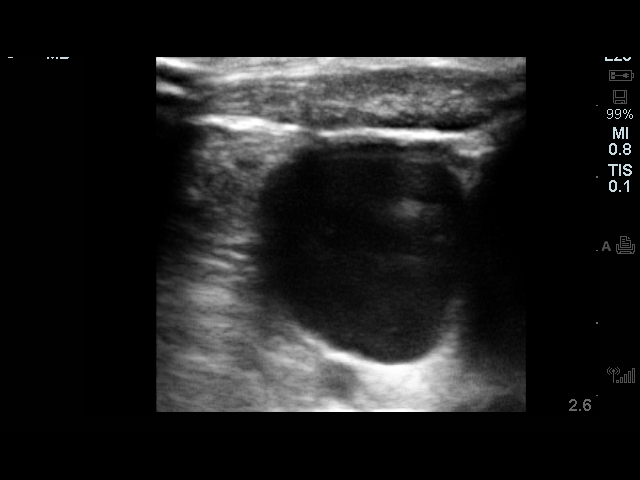

[1 of 1 positions shown; findings below may reference images not displayed]

EXAM:
REMOVAL TUNNELED CENTRAL VENOUS CATHETER

PROCEDURE:
The patient's right chest and catheter was prepped and draped in a
normal sterile fashion. Heparin was removed from both ports of
catheter. 1% lidocaine was used for local anesthesia. Using gentle
blunt dissection the cuff of the catheter was exposed and the
catheter was removed in it's entirety. Pressure was held till
hemostasis was obtained. A sterile dressing was applied. The patient
tolerated the procedure well with no immediate complications.

COMPLICATIONS:
None immediate.
IMPRESSION: Successful catheter removal as described above.
